# Patient Record
Sex: Male | Born: 1950 | Race: White | Hispanic: No | Marital: Single | Smoking: Never smoker
Health system: Southern US, Community
[De-identification: ages and names within clinical notes are randomized; demographics above are authoritative.]

## PROBLEM LIST (undated history)

## (undated) DIAGNOSIS — R519 Headache, unspecified: Secondary | ICD-10-CM

## (undated) DIAGNOSIS — F410 Panic disorder [episodic paroxysmal anxiety] without agoraphobia: Secondary | ICD-10-CM

## (undated) DIAGNOSIS — R51 Headache: Secondary | ICD-10-CM

## (undated) DIAGNOSIS — M1990 Unspecified osteoarthritis, unspecified site: Secondary | ICD-10-CM

## (undated) DIAGNOSIS — I10 Essential (primary) hypertension: Secondary | ICD-10-CM

## (undated) DIAGNOSIS — E785 Hyperlipidemia, unspecified: Secondary | ICD-10-CM

## (undated) DIAGNOSIS — G709 Myoneural disorder, unspecified: Secondary | ICD-10-CM

## (undated) DIAGNOSIS — F419 Anxiety disorder, unspecified: Secondary | ICD-10-CM

## (undated) DIAGNOSIS — J302 Other seasonal allergic rhinitis: Secondary | ICD-10-CM

## (undated) DIAGNOSIS — R252 Cramp and spasm: Secondary | ICD-10-CM

## (undated) HISTORY — PX: SPINAL CORD STIMULATOR REMOVAL: SHX2423

## (undated) HISTORY — PX: TONSILLECTOMY: SUR1361

## (undated) HISTORY — PX: CHOLECYSTECTOMY: SHX55

## (undated) HISTORY — PX: KNEE SURGERY: SHX244

## (undated) HISTORY — PX: SPINAL CORD STIMULATOR INSERTION: SHX5378

## (undated) HISTORY — PX: WISDOM TOOTH EXTRACTION: SHX21

## (undated) HISTORY — PX: OTHER SURGICAL HISTORY: SHX169

## (undated) HISTORY — PX: BACK SURGERY: SHX140

## (undated) HISTORY — PX: COLONOSCOPY: SHX174

---

## 1999-08-27 ENCOUNTER — Encounter: Payer: Self-pay | Admitting: Neurological Surgery

## 1999-08-27 ENCOUNTER — Ambulatory Visit (HOSPITAL_COMMUNITY): Admission: RE | Admit: 1999-08-27 | Discharge: 1999-08-27 | Payer: Self-pay | Admitting: Neurological Surgery

## 1999-09-21 ENCOUNTER — Encounter: Payer: Self-pay | Admitting: Neurological Surgery

## 1999-09-23 ENCOUNTER — Inpatient Hospital Stay (HOSPITAL_COMMUNITY): Admission: RE | Admit: 1999-09-23 | Discharge: 1999-09-29 | Payer: Self-pay | Admitting: Neurological Surgery

## 1999-09-23 ENCOUNTER — Encounter: Payer: Self-pay | Admitting: Neurological Surgery

## 1999-11-26 ENCOUNTER — Encounter: Payer: Self-pay | Admitting: Neurological Surgery

## 1999-11-26 ENCOUNTER — Encounter: Admission: RE | Admit: 1999-11-26 | Discharge: 1999-11-26 | Payer: Self-pay | Admitting: Neurological Surgery

## 2000-02-16 ENCOUNTER — Encounter: Admission: RE | Admit: 2000-02-16 | Discharge: 2000-02-16 | Payer: Self-pay | Admitting: Neurological Surgery

## 2000-02-16 ENCOUNTER — Encounter: Payer: Self-pay | Admitting: Neurological Surgery

## 2000-09-15 ENCOUNTER — Encounter: Admission: RE | Admit: 2000-09-15 | Discharge: 2000-12-14 | Payer: Self-pay | Admitting: Anesthesiology

## 2000-12-15 ENCOUNTER — Encounter: Admission: RE | Admit: 2000-12-15 | Discharge: 2001-01-06 | Payer: Self-pay | Admitting: Anesthesiology

## 2011-05-18 DIAGNOSIS — E291 Testicular hypofunction: Secondary | ICD-10-CM | POA: Diagnosis not present

## 2011-06-01 DIAGNOSIS — E291 Testicular hypofunction: Secondary | ICD-10-CM | POA: Diagnosis not present

## 2011-06-09 DIAGNOSIS — G890 Central pain syndrome: Secondary | ICD-10-CM | POA: Diagnosis not present

## 2011-06-09 DIAGNOSIS — M5137 Other intervertebral disc degeneration, lumbosacral region: Secondary | ICD-10-CM | POA: Diagnosis not present

## 2011-06-15 DIAGNOSIS — E291 Testicular hypofunction: Secondary | ICD-10-CM | POA: Diagnosis not present

## 2011-06-29 DIAGNOSIS — E291 Testicular hypofunction: Secondary | ICD-10-CM | POA: Diagnosis not present

## 2011-07-13 DIAGNOSIS — E785 Hyperlipidemia, unspecified: Secondary | ICD-10-CM | POA: Diagnosis not present

## 2011-07-13 DIAGNOSIS — I10 Essential (primary) hypertension: Secondary | ICD-10-CM | POA: Diagnosis not present

## 2011-07-13 DIAGNOSIS — Z79899 Other long term (current) drug therapy: Secondary | ICD-10-CM | POA: Diagnosis not present

## 2011-07-13 DIAGNOSIS — E291 Testicular hypofunction: Secondary | ICD-10-CM | POA: Diagnosis not present

## 2011-07-13 DIAGNOSIS — Z6830 Body mass index (BMI) 30.0-30.9, adult: Secondary | ICD-10-CM | POA: Diagnosis not present

## 2011-07-26 DIAGNOSIS — N390 Urinary tract infection, site not specified: Secondary | ICD-10-CM | POA: Diagnosis not present

## 2011-07-26 DIAGNOSIS — E291 Testicular hypofunction: Secondary | ICD-10-CM | POA: Diagnosis not present

## 2011-07-26 DIAGNOSIS — N400 Enlarged prostate without lower urinary tract symptoms: Secondary | ICD-10-CM | POA: Diagnosis not present

## 2011-07-26 DIAGNOSIS — R972 Elevated prostate specific antigen [PSA]: Secondary | ICD-10-CM | POA: Diagnosis not present

## 2011-08-09 DIAGNOSIS — E291 Testicular hypofunction: Secondary | ICD-10-CM | POA: Diagnosis not present

## 2011-08-18 DIAGNOSIS — G890 Central pain syndrome: Secondary | ICD-10-CM | POA: Diagnosis not present

## 2011-08-18 DIAGNOSIS — Z79899 Other long term (current) drug therapy: Secondary | ICD-10-CM | POA: Diagnosis not present

## 2011-08-23 DIAGNOSIS — E291 Testicular hypofunction: Secondary | ICD-10-CM | POA: Diagnosis not present

## 2011-09-07 DIAGNOSIS — E291 Testicular hypofunction: Secondary | ICD-10-CM | POA: Diagnosis not present

## 2011-09-21 DIAGNOSIS — E291 Testicular hypofunction: Secondary | ICD-10-CM | POA: Diagnosis not present

## 2011-10-06 DIAGNOSIS — E291 Testicular hypofunction: Secondary | ICD-10-CM | POA: Diagnosis not present

## 2011-10-20 DIAGNOSIS — E291 Testicular hypofunction: Secondary | ICD-10-CM | POA: Diagnosis not present

## 2011-10-20 DIAGNOSIS — I10 Essential (primary) hypertension: Secondary | ICD-10-CM | POA: Diagnosis not present

## 2011-10-20 DIAGNOSIS — E785 Hyperlipidemia, unspecified: Secondary | ICD-10-CM | POA: Diagnosis not present

## 2011-10-20 DIAGNOSIS — Z6830 Body mass index (BMI) 30.0-30.9, adult: Secondary | ICD-10-CM | POA: Diagnosis not present

## 2011-10-25 DIAGNOSIS — I6529 Occlusion and stenosis of unspecified carotid artery: Secondary | ICD-10-CM | POA: Diagnosis not present

## 2011-10-27 DIAGNOSIS — G894 Chronic pain syndrome: Secondary | ICD-10-CM | POA: Diagnosis not present

## 2011-10-27 DIAGNOSIS — G890 Central pain syndrome: Secondary | ICD-10-CM | POA: Diagnosis not present

## 2011-10-27 DIAGNOSIS — M5137 Other intervertebral disc degeneration, lumbosacral region: Secondary | ICD-10-CM | POA: Diagnosis not present

## 2011-11-03 DIAGNOSIS — E291 Testicular hypofunction: Secondary | ICD-10-CM | POA: Diagnosis not present

## 2011-11-17 DIAGNOSIS — E291 Testicular hypofunction: Secondary | ICD-10-CM | POA: Diagnosis not present

## 2011-12-01 DIAGNOSIS — G890 Central pain syndrome: Secondary | ICD-10-CM | POA: Diagnosis not present

## 2011-12-01 DIAGNOSIS — S142XXA Injury of nerve root of cervical spine, initial encounter: Secondary | ICD-10-CM | POA: Diagnosis not present

## 2011-12-01 DIAGNOSIS — G894 Chronic pain syndrome: Secondary | ICD-10-CM | POA: Diagnosis not present

## 2011-12-02 DIAGNOSIS — E291 Testicular hypofunction: Secondary | ICD-10-CM | POA: Diagnosis not present

## 2011-12-15 DIAGNOSIS — G609 Hereditary and idiopathic neuropathy, unspecified: Secondary | ICD-10-CM | POA: Diagnosis not present

## 2011-12-15 DIAGNOSIS — G890 Central pain syndrome: Secondary | ICD-10-CM | POA: Diagnosis not present

## 2011-12-15 DIAGNOSIS — G894 Chronic pain syndrome: Secondary | ICD-10-CM | POA: Diagnosis not present

## 2011-12-16 DIAGNOSIS — E291 Testicular hypofunction: Secondary | ICD-10-CM | POA: Diagnosis not present

## 2011-12-30 DIAGNOSIS — E291 Testicular hypofunction: Secondary | ICD-10-CM | POA: Diagnosis not present

## 2011-12-30 DIAGNOSIS — G894 Chronic pain syndrome: Secondary | ICD-10-CM | POA: Diagnosis not present

## 2011-12-30 DIAGNOSIS — G890 Central pain syndrome: Secondary | ICD-10-CM | POA: Diagnosis not present

## 2012-01-06 DIAGNOSIS — L219 Seborrheic dermatitis, unspecified: Secondary | ICD-10-CM | POA: Diagnosis not present

## 2012-01-06 DIAGNOSIS — L738 Other specified follicular disorders: Secondary | ICD-10-CM | POA: Diagnosis not present

## 2012-01-06 DIAGNOSIS — L981 Factitial dermatitis: Secondary | ICD-10-CM | POA: Diagnosis not present

## 2012-01-13 DIAGNOSIS — E291 Testicular hypofunction: Secondary | ICD-10-CM | POA: Diagnosis not present

## 2012-01-23 DIAGNOSIS — E785 Hyperlipidemia, unspecified: Secondary | ICD-10-CM | POA: Diagnosis not present

## 2012-01-23 DIAGNOSIS — E291 Testicular hypofunction: Secondary | ICD-10-CM | POA: Diagnosis not present

## 2012-01-23 DIAGNOSIS — I10 Essential (primary) hypertension: Secondary | ICD-10-CM | POA: Diagnosis not present

## 2012-01-26 DIAGNOSIS — G894 Chronic pain syndrome: Secondary | ICD-10-CM | POA: Diagnosis not present

## 2012-01-26 DIAGNOSIS — G890 Central pain syndrome: Secondary | ICD-10-CM | POA: Diagnosis not present

## 2012-01-27 DIAGNOSIS — E291 Testicular hypofunction: Secondary | ICD-10-CM | POA: Diagnosis not present

## 2012-02-03 DIAGNOSIS — Z79899 Other long term (current) drug therapy: Secondary | ICD-10-CM | POA: Diagnosis not present

## 2012-02-03 DIAGNOSIS — G890 Central pain syndrome: Secondary | ICD-10-CM | POA: Diagnosis not present

## 2012-02-03 DIAGNOSIS — G894 Chronic pain syndrome: Secondary | ICD-10-CM | POA: Diagnosis not present

## 2012-02-03 DIAGNOSIS — Z5181 Encounter for therapeutic drug level monitoring: Secondary | ICD-10-CM | POA: Diagnosis not present

## 2012-02-10 DIAGNOSIS — E291 Testicular hypofunction: Secondary | ICD-10-CM | POA: Diagnosis not present

## 2012-02-10 DIAGNOSIS — Z23 Encounter for immunization: Secondary | ICD-10-CM | POA: Diagnosis not present

## 2012-02-24 DIAGNOSIS — E291 Testicular hypofunction: Secondary | ICD-10-CM | POA: Diagnosis not present

## 2012-02-29 DIAGNOSIS — G890 Central pain syndrome: Secondary | ICD-10-CM | POA: Diagnosis not present

## 2012-03-09 DIAGNOSIS — E291 Testicular hypofunction: Secondary | ICD-10-CM | POA: Diagnosis not present

## 2012-03-20 DIAGNOSIS — G894 Chronic pain syndrome: Secondary | ICD-10-CM | POA: Diagnosis not present

## 2012-03-20 DIAGNOSIS — M5137 Other intervertebral disc degeneration, lumbosacral region: Secondary | ICD-10-CM | POA: Diagnosis not present

## 2012-03-23 DIAGNOSIS — E291 Testicular hypofunction: Secondary | ICD-10-CM | POA: Diagnosis not present

## 2012-03-28 DIAGNOSIS — G894 Chronic pain syndrome: Secondary | ICD-10-CM | POA: Diagnosis not present

## 2012-03-28 DIAGNOSIS — G890 Central pain syndrome: Secondary | ICD-10-CM | POA: Diagnosis not present

## 2012-04-06 DIAGNOSIS — E291 Testicular hypofunction: Secondary | ICD-10-CM | POA: Diagnosis not present

## 2012-04-20 DIAGNOSIS — E291 Testicular hypofunction: Secondary | ICD-10-CM | POA: Diagnosis not present

## 2012-04-26 DIAGNOSIS — E291 Testicular hypofunction: Secondary | ICD-10-CM | POA: Diagnosis not present

## 2012-04-26 DIAGNOSIS — Z6829 Body mass index (BMI) 29.0-29.9, adult: Secondary | ICD-10-CM | POA: Diagnosis not present

## 2012-04-26 DIAGNOSIS — E785 Hyperlipidemia, unspecified: Secondary | ICD-10-CM | POA: Diagnosis not present

## 2012-04-26 DIAGNOSIS — I10 Essential (primary) hypertension: Secondary | ICD-10-CM | POA: Diagnosis not present

## 2012-04-26 DIAGNOSIS — R972 Elevated prostate specific antigen [PSA]: Secondary | ICD-10-CM | POA: Diagnosis not present

## 2012-05-04 DIAGNOSIS — E291 Testicular hypofunction: Secondary | ICD-10-CM | POA: Diagnosis not present

## 2012-05-10 DIAGNOSIS — G894 Chronic pain syndrome: Secondary | ICD-10-CM | POA: Diagnosis not present

## 2012-05-10 DIAGNOSIS — G890 Central pain syndrome: Secondary | ICD-10-CM | POA: Diagnosis not present

## 2012-05-17 DIAGNOSIS — G894 Chronic pain syndrome: Secondary | ICD-10-CM | POA: Diagnosis not present

## 2012-05-17 DIAGNOSIS — G890 Central pain syndrome: Secondary | ICD-10-CM | POA: Diagnosis not present

## 2012-05-24 DIAGNOSIS — G890 Central pain syndrome: Secondary | ICD-10-CM | POA: Diagnosis not present

## 2012-05-24 DIAGNOSIS — E291 Testicular hypofunction: Secondary | ICD-10-CM | POA: Diagnosis not present

## 2012-05-24 DIAGNOSIS — G8921 Chronic pain due to trauma: Secondary | ICD-10-CM | POA: Diagnosis not present

## 2012-05-24 DIAGNOSIS — M545 Low back pain: Secondary | ICD-10-CM | POA: Diagnosis not present

## 2012-05-24 DIAGNOSIS — G8929 Other chronic pain: Secondary | ICD-10-CM | POA: Diagnosis not present

## 2012-05-24 DIAGNOSIS — G894 Chronic pain syndrome: Secondary | ICD-10-CM | POA: Diagnosis not present

## 2012-06-05 DIAGNOSIS — R52 Pain, unspecified: Secondary | ICD-10-CM | POA: Diagnosis not present

## 2012-06-05 DIAGNOSIS — G894 Chronic pain syndrome: Secondary | ICD-10-CM | POA: Diagnosis not present

## 2012-06-05 DIAGNOSIS — G890 Central pain syndrome: Secondary | ICD-10-CM | POA: Diagnosis not present

## 2012-06-08 DIAGNOSIS — E291 Testicular hypofunction: Secondary | ICD-10-CM | POA: Diagnosis not present

## 2012-06-22 DIAGNOSIS — E291 Testicular hypofunction: Secondary | ICD-10-CM | POA: Diagnosis not present

## 2012-07-06 DIAGNOSIS — Z6829 Body mass index (BMI) 29.0-29.9, adult: Secondary | ICD-10-CM | POA: Diagnosis not present

## 2012-07-06 DIAGNOSIS — J019 Acute sinusitis, unspecified: Secondary | ICD-10-CM | POA: Diagnosis not present

## 2012-07-06 DIAGNOSIS — E291 Testicular hypofunction: Secondary | ICD-10-CM | POA: Diagnosis not present

## 2012-07-06 DIAGNOSIS — S14125A Central cord syndrome at C5 level of cervical spinal cord, initial encounter: Secondary | ICD-10-CM | POA: Diagnosis not present

## 2012-07-20 DIAGNOSIS — E291 Testicular hypofunction: Secondary | ICD-10-CM | POA: Diagnosis not present

## 2012-08-03 DIAGNOSIS — E291 Testicular hypofunction: Secondary | ICD-10-CM | POA: Diagnosis not present

## 2012-08-03 DIAGNOSIS — R972 Elevated prostate specific antigen [PSA]: Secondary | ICD-10-CM | POA: Diagnosis not present

## 2012-08-03 DIAGNOSIS — Z6829 Body mass index (BMI) 29.0-29.9, adult: Secondary | ICD-10-CM | POA: Diagnosis not present

## 2012-08-03 DIAGNOSIS — I10 Essential (primary) hypertension: Secondary | ICD-10-CM | POA: Diagnosis not present

## 2012-08-03 DIAGNOSIS — E785 Hyperlipidemia, unspecified: Secondary | ICD-10-CM | POA: Diagnosis not present

## 2012-08-17 DIAGNOSIS — E291 Testicular hypofunction: Secondary | ICD-10-CM | POA: Diagnosis not present

## 2012-08-23 DIAGNOSIS — G894 Chronic pain syndrome: Secondary | ICD-10-CM | POA: Diagnosis not present

## 2012-08-23 DIAGNOSIS — G890 Central pain syndrome: Secondary | ICD-10-CM | POA: Diagnosis not present

## 2012-08-23 DIAGNOSIS — M79609 Pain in unspecified limb: Secondary | ICD-10-CM | POA: Diagnosis not present

## 2012-08-23 DIAGNOSIS — Z79899 Other long term (current) drug therapy: Secondary | ICD-10-CM | POA: Diagnosis not present

## 2012-08-31 DIAGNOSIS — E291 Testicular hypofunction: Secondary | ICD-10-CM | POA: Diagnosis not present

## 2012-09-14 DIAGNOSIS — E291 Testicular hypofunction: Secondary | ICD-10-CM | POA: Diagnosis not present

## 2012-09-19 DIAGNOSIS — M79609 Pain in unspecified limb: Secondary | ICD-10-CM | POA: Diagnosis not present

## 2012-09-19 DIAGNOSIS — Z79899 Other long term (current) drug therapy: Secondary | ICD-10-CM | POA: Diagnosis not present

## 2012-09-19 DIAGNOSIS — G890 Central pain syndrome: Secondary | ICD-10-CM | POA: Diagnosis not present

## 2012-09-19 DIAGNOSIS — G894 Chronic pain syndrome: Secondary | ICD-10-CM | POA: Diagnosis not present

## 2012-09-27 DIAGNOSIS — E291 Testicular hypofunction: Secondary | ICD-10-CM | POA: Diagnosis not present

## 2012-10-11 DIAGNOSIS — E291 Testicular hypofunction: Secondary | ICD-10-CM | POA: Diagnosis not present

## 2012-10-25 DIAGNOSIS — E291 Testicular hypofunction: Secondary | ICD-10-CM | POA: Diagnosis not present

## 2012-11-08 DIAGNOSIS — E291 Testicular hypofunction: Secondary | ICD-10-CM | POA: Diagnosis not present

## 2012-11-08 DIAGNOSIS — I10 Essential (primary) hypertension: Secondary | ICD-10-CM | POA: Diagnosis not present

## 2012-11-08 DIAGNOSIS — Z6829 Body mass index (BMI) 29.0-29.9, adult: Secondary | ICD-10-CM | POA: Diagnosis not present

## 2012-11-08 DIAGNOSIS — E785 Hyperlipidemia, unspecified: Secondary | ICD-10-CM | POA: Diagnosis not present

## 2012-11-14 DIAGNOSIS — Z79899 Other long term (current) drug therapy: Secondary | ICD-10-CM | POA: Diagnosis not present

## 2012-11-14 DIAGNOSIS — G894 Chronic pain syndrome: Secondary | ICD-10-CM | POA: Diagnosis not present

## 2012-11-14 DIAGNOSIS — M79609 Pain in unspecified limb: Secondary | ICD-10-CM | POA: Diagnosis not present

## 2012-11-14 DIAGNOSIS — G890 Central pain syndrome: Secondary | ICD-10-CM | POA: Diagnosis not present

## 2012-11-22 DIAGNOSIS — E291 Testicular hypofunction: Secondary | ICD-10-CM | POA: Diagnosis not present

## 2012-12-06 DIAGNOSIS — E291 Testicular hypofunction: Secondary | ICD-10-CM | POA: Diagnosis not present

## 2012-12-17 DIAGNOSIS — Z4689 Encounter for fitting and adjustment of other specified devices: Secondary | ICD-10-CM | POA: Diagnosis not present

## 2012-12-17 DIAGNOSIS — M549 Dorsalgia, unspecified: Secondary | ICD-10-CM | POA: Diagnosis not present

## 2012-12-17 DIAGNOSIS — E7800 Pure hypercholesterolemia, unspecified: Secondary | ICD-10-CM | POA: Diagnosis not present

## 2012-12-17 DIAGNOSIS — F411 Generalized anxiety disorder: Secondary | ICD-10-CM | POA: Diagnosis not present

## 2012-12-17 DIAGNOSIS — IMO0001 Reserved for inherently not codable concepts without codable children: Secondary | ICD-10-CM | POA: Diagnosis not present

## 2012-12-17 DIAGNOSIS — G894 Chronic pain syndrome: Secondary | ICD-10-CM | POA: Diagnosis not present

## 2012-12-17 DIAGNOSIS — G609 Hereditary and idiopathic neuropathy, unspecified: Secondary | ICD-10-CM | POA: Diagnosis not present

## 2012-12-17 DIAGNOSIS — I10 Essential (primary) hypertension: Secondary | ICD-10-CM | POA: Diagnosis not present

## 2012-12-17 DIAGNOSIS — R209 Unspecified disturbances of skin sensation: Secondary | ICD-10-CM | POA: Diagnosis not present

## 2012-12-17 DIAGNOSIS — G890 Central pain syndrome: Secondary | ICD-10-CM | POA: Diagnosis not present

## 2012-12-20 DIAGNOSIS — E291 Testicular hypofunction: Secondary | ICD-10-CM | POA: Diagnosis not present

## 2013-01-03 DIAGNOSIS — E291 Testicular hypofunction: Secondary | ICD-10-CM | POA: Diagnosis not present

## 2013-01-09 DIAGNOSIS — M79609 Pain in unspecified limb: Secondary | ICD-10-CM | POA: Diagnosis not present

## 2013-01-09 DIAGNOSIS — Z79899 Other long term (current) drug therapy: Secondary | ICD-10-CM | POA: Diagnosis not present

## 2013-01-09 DIAGNOSIS — G890 Central pain syndrome: Secondary | ICD-10-CM | POA: Diagnosis not present

## 2013-01-09 DIAGNOSIS — G894 Chronic pain syndrome: Secondary | ICD-10-CM | POA: Diagnosis not present

## 2013-01-17 DIAGNOSIS — E291 Testicular hypofunction: Secondary | ICD-10-CM | POA: Diagnosis not present

## 2013-01-17 DIAGNOSIS — L981 Factitial dermatitis: Secondary | ICD-10-CM | POA: Diagnosis not present

## 2013-01-17 DIAGNOSIS — L219 Seborrheic dermatitis, unspecified: Secondary | ICD-10-CM | POA: Diagnosis not present

## 2013-01-31 DIAGNOSIS — E291 Testicular hypofunction: Secondary | ICD-10-CM | POA: Diagnosis not present

## 2013-02-14 DIAGNOSIS — E785 Hyperlipidemia, unspecified: Secondary | ICD-10-CM | POA: Diagnosis not present

## 2013-02-14 DIAGNOSIS — Z23 Encounter for immunization: Secondary | ICD-10-CM | POA: Diagnosis not present

## 2013-02-14 DIAGNOSIS — I10 Essential (primary) hypertension: Secondary | ICD-10-CM | POA: Diagnosis not present

## 2013-02-14 DIAGNOSIS — E291 Testicular hypofunction: Secondary | ICD-10-CM | POA: Diagnosis not present

## 2013-02-28 DIAGNOSIS — E291 Testicular hypofunction: Secondary | ICD-10-CM | POA: Diagnosis not present

## 2013-03-07 DIAGNOSIS — G890 Central pain syndrome: Secondary | ICD-10-CM | POA: Diagnosis not present

## 2013-03-07 DIAGNOSIS — Z79899 Other long term (current) drug therapy: Secondary | ICD-10-CM | POA: Diagnosis not present

## 2013-03-07 DIAGNOSIS — G894 Chronic pain syndrome: Secondary | ICD-10-CM | POA: Diagnosis not present

## 2013-03-07 DIAGNOSIS — M79609 Pain in unspecified limb: Secondary | ICD-10-CM | POA: Diagnosis not present

## 2013-03-14 DIAGNOSIS — E291 Testicular hypofunction: Secondary | ICD-10-CM | POA: Diagnosis not present

## 2013-03-25 DIAGNOSIS — G894 Chronic pain syndrome: Secondary | ICD-10-CM | POA: Diagnosis not present

## 2013-03-25 DIAGNOSIS — Z79899 Other long term (current) drug therapy: Secondary | ICD-10-CM | POA: Diagnosis not present

## 2013-03-25 DIAGNOSIS — Z5181 Encounter for therapeutic drug level monitoring: Secondary | ICD-10-CM | POA: Diagnosis not present

## 2013-03-25 DIAGNOSIS — G890 Central pain syndrome: Secondary | ICD-10-CM | POA: Diagnosis not present

## 2013-03-25 DIAGNOSIS — M79609 Pain in unspecified limb: Secondary | ICD-10-CM | POA: Diagnosis not present

## 2013-03-28 DIAGNOSIS — E291 Testicular hypofunction: Secondary | ICD-10-CM | POA: Diagnosis not present

## 2013-04-11 DIAGNOSIS — E291 Testicular hypofunction: Secondary | ICD-10-CM | POA: Diagnosis not present

## 2013-04-22 DIAGNOSIS — M79609 Pain in unspecified limb: Secondary | ICD-10-CM | POA: Diagnosis not present

## 2013-04-22 DIAGNOSIS — M62838 Other muscle spasm: Secondary | ICD-10-CM | POA: Diagnosis not present

## 2013-04-22 DIAGNOSIS — G890 Central pain syndrome: Secondary | ICD-10-CM | POA: Diagnosis not present

## 2013-04-22 DIAGNOSIS — G894 Chronic pain syndrome: Secondary | ICD-10-CM | POA: Diagnosis not present

## 2013-04-25 DIAGNOSIS — E291 Testicular hypofunction: Secondary | ICD-10-CM | POA: Diagnosis not present

## 2013-05-10 DIAGNOSIS — E291 Testicular hypofunction: Secondary | ICD-10-CM | POA: Diagnosis not present

## 2013-05-23 DIAGNOSIS — I10 Essential (primary) hypertension: Secondary | ICD-10-CM | POA: Diagnosis not present

## 2013-05-23 DIAGNOSIS — E785 Hyperlipidemia, unspecified: Secondary | ICD-10-CM | POA: Diagnosis not present

## 2013-05-23 DIAGNOSIS — Z6829 Body mass index (BMI) 29.0-29.9, adult: Secondary | ICD-10-CM | POA: Diagnosis not present

## 2013-05-23 DIAGNOSIS — E291 Testicular hypofunction: Secondary | ICD-10-CM | POA: Diagnosis not present

## 2013-06-06 DIAGNOSIS — E291 Testicular hypofunction: Secondary | ICD-10-CM | POA: Diagnosis not present

## 2013-06-13 DIAGNOSIS — Z79899 Other long term (current) drug therapy: Secondary | ICD-10-CM | POA: Diagnosis not present

## 2013-06-13 DIAGNOSIS — M62838 Other muscle spasm: Secondary | ICD-10-CM | POA: Diagnosis not present

## 2013-06-13 DIAGNOSIS — G894 Chronic pain syndrome: Secondary | ICD-10-CM | POA: Diagnosis not present

## 2013-06-13 DIAGNOSIS — G890 Central pain syndrome: Secondary | ICD-10-CM | POA: Diagnosis not present

## 2013-06-20 DIAGNOSIS — E291 Testicular hypofunction: Secondary | ICD-10-CM | POA: Diagnosis not present

## 2013-07-10 DIAGNOSIS — E291 Testicular hypofunction: Secondary | ICD-10-CM | POA: Diagnosis not present

## 2013-07-18 DIAGNOSIS — L981 Factitial dermatitis: Secondary | ICD-10-CM | POA: Diagnosis not present

## 2013-07-18 DIAGNOSIS — L219 Seborrheic dermatitis, unspecified: Secondary | ICD-10-CM | POA: Diagnosis not present

## 2013-07-24 DIAGNOSIS — E291 Testicular hypofunction: Secondary | ICD-10-CM | POA: Diagnosis not present

## 2013-08-08 DIAGNOSIS — E291 Testicular hypofunction: Secondary | ICD-10-CM | POA: Diagnosis not present

## 2013-08-19 DIAGNOSIS — IMO0002 Reserved for concepts with insufficient information to code with codable children: Secondary | ICD-10-CM | POA: Diagnosis not present

## 2013-08-19 DIAGNOSIS — G894 Chronic pain syndrome: Secondary | ICD-10-CM | POA: Diagnosis not present

## 2013-08-19 DIAGNOSIS — G8250 Quadriplegia, unspecified: Secondary | ICD-10-CM | POA: Diagnosis not present

## 2013-08-19 DIAGNOSIS — R259 Unspecified abnormal involuntary movements: Secondary | ICD-10-CM | POA: Diagnosis not present

## 2013-08-26 DIAGNOSIS — E785 Hyperlipidemia, unspecified: Secondary | ICD-10-CM | POA: Diagnosis not present

## 2013-08-26 DIAGNOSIS — E291 Testicular hypofunction: Secondary | ICD-10-CM | POA: Diagnosis not present

## 2013-08-26 DIAGNOSIS — Z125 Encounter for screening for malignant neoplasm of prostate: Secondary | ICD-10-CM | POA: Diagnosis not present

## 2013-08-26 DIAGNOSIS — I10 Essential (primary) hypertension: Secondary | ICD-10-CM | POA: Diagnosis not present

## 2013-08-26 DIAGNOSIS — Z6829 Body mass index (BMI) 29.0-29.9, adult: Secondary | ICD-10-CM | POA: Diagnosis not present

## 2013-08-29 DIAGNOSIS — L219 Seborrheic dermatitis, unspecified: Secondary | ICD-10-CM | POA: Diagnosis not present

## 2013-08-29 DIAGNOSIS — L719 Rosacea, unspecified: Secondary | ICD-10-CM | POA: Diagnosis not present

## 2013-09-05 DIAGNOSIS — G894 Chronic pain syndrome: Secondary | ICD-10-CM | POA: Diagnosis not present

## 2013-09-05 DIAGNOSIS — R259 Unspecified abnormal involuntary movements: Secondary | ICD-10-CM | POA: Diagnosis not present

## 2013-09-11 DIAGNOSIS — M48061 Spinal stenosis, lumbar region without neurogenic claudication: Secondary | ICD-10-CM | POA: Diagnosis not present

## 2013-09-11 DIAGNOSIS — E291 Testicular hypofunction: Secondary | ICD-10-CM | POA: Diagnosis not present

## 2013-09-11 DIAGNOSIS — R29898 Other symptoms and signs involving the musculoskeletal system: Secondary | ICD-10-CM | POA: Diagnosis not present

## 2013-09-11 DIAGNOSIS — M5124 Other intervertebral disc displacement, thoracic region: Secondary | ICD-10-CM | POA: Diagnosis not present

## 2013-09-26 DIAGNOSIS — E291 Testicular hypofunction: Secondary | ICD-10-CM | POA: Diagnosis not present

## 2013-10-10 DIAGNOSIS — E291 Testicular hypofunction: Secondary | ICD-10-CM | POA: Diagnosis not present

## 2013-10-10 DIAGNOSIS — Z6827 Body mass index (BMI) 27.0-27.9, adult: Secondary | ICD-10-CM | POA: Diagnosis not present

## 2013-10-10 DIAGNOSIS — F410 Panic disorder [episodic paroxysmal anxiety] without agoraphobia: Secondary | ICD-10-CM | POA: Diagnosis not present

## 2013-10-24 DIAGNOSIS — E291 Testicular hypofunction: Secondary | ICD-10-CM | POA: Diagnosis not present

## 2013-10-30 DIAGNOSIS — G894 Chronic pain syndrome: Secondary | ICD-10-CM | POA: Diagnosis not present

## 2013-10-30 DIAGNOSIS — R259 Unspecified abnormal involuntary movements: Secondary | ICD-10-CM | POA: Diagnosis not present

## 2013-10-30 DIAGNOSIS — IMO0002 Reserved for concepts with insufficient information to code with codable children: Secondary | ICD-10-CM | POA: Diagnosis not present

## 2013-10-30 DIAGNOSIS — Z79899 Other long term (current) drug therapy: Secondary | ICD-10-CM | POA: Diagnosis not present

## 2013-10-30 DIAGNOSIS — G8250 Quadriplegia, unspecified: Secondary | ICD-10-CM | POA: Diagnosis not present

## 2013-11-06 DIAGNOSIS — E291 Testicular hypofunction: Secondary | ICD-10-CM | POA: Diagnosis not present

## 2013-11-20 DIAGNOSIS — E291 Testicular hypofunction: Secondary | ICD-10-CM | POA: Diagnosis not present

## 2013-12-04 DIAGNOSIS — E291 Testicular hypofunction: Secondary | ICD-10-CM | POA: Diagnosis not present

## 2013-12-04 DIAGNOSIS — Z23 Encounter for immunization: Secondary | ICD-10-CM | POA: Diagnosis not present

## 2013-12-04 DIAGNOSIS — I10 Essential (primary) hypertension: Secondary | ICD-10-CM | POA: Diagnosis not present

## 2013-12-04 DIAGNOSIS — Z6828 Body mass index (BMI) 28.0-28.9, adult: Secondary | ICD-10-CM | POA: Diagnosis not present

## 2013-12-04 DIAGNOSIS — E785 Hyperlipidemia, unspecified: Secondary | ICD-10-CM | POA: Diagnosis not present

## 2013-12-19 DIAGNOSIS — E291 Testicular hypofunction: Secondary | ICD-10-CM | POA: Diagnosis not present

## 2013-12-25 DIAGNOSIS — G8250 Quadriplegia, unspecified: Secondary | ICD-10-CM | POA: Diagnosis not present

## 2013-12-25 DIAGNOSIS — G890 Central pain syndrome: Secondary | ICD-10-CM | POA: Diagnosis not present

## 2013-12-25 DIAGNOSIS — IMO0002 Reserved for concepts with insufficient information to code with codable children: Secondary | ICD-10-CM | POA: Diagnosis not present

## 2013-12-25 DIAGNOSIS — G894 Chronic pain syndrome: Secondary | ICD-10-CM | POA: Diagnosis not present

## 2013-12-26 DIAGNOSIS — L219 Seborrheic dermatitis, unspecified: Secondary | ICD-10-CM | POA: Diagnosis not present

## 2013-12-26 DIAGNOSIS — L719 Rosacea, unspecified: Secondary | ICD-10-CM | POA: Diagnosis not present

## 2013-12-26 DIAGNOSIS — L981 Factitial dermatitis: Secondary | ICD-10-CM | POA: Diagnosis not present

## 2014-01-02 DIAGNOSIS — E291 Testicular hypofunction: Secondary | ICD-10-CM | POA: Diagnosis not present

## 2014-01-16 DIAGNOSIS — G894 Chronic pain syndrome: Secondary | ICD-10-CM | POA: Diagnosis not present

## 2014-01-16 DIAGNOSIS — G890 Central pain syndrome: Secondary | ICD-10-CM | POA: Diagnosis not present

## 2014-01-16 DIAGNOSIS — G8250 Quadriplegia, unspecified: Secondary | ICD-10-CM | POA: Diagnosis not present

## 2014-01-16 DIAGNOSIS — IMO0002 Reserved for concepts with insufficient information to code with codable children: Secondary | ICD-10-CM | POA: Diagnosis not present

## 2014-01-17 DIAGNOSIS — E291 Testicular hypofunction: Secondary | ICD-10-CM | POA: Diagnosis not present

## 2014-01-29 DIAGNOSIS — E291 Testicular hypofunction: Secondary | ICD-10-CM | POA: Diagnosis not present

## 2014-02-12 DIAGNOSIS — E290 Testicular hyperfunction: Secondary | ICD-10-CM | POA: Diagnosis not present

## 2014-02-13 DIAGNOSIS — G8250 Quadriplegia, unspecified: Secondary | ICD-10-CM | POA: Diagnosis not present

## 2014-02-13 DIAGNOSIS — G890 Central pain syndrome: Secondary | ICD-10-CM | POA: Diagnosis not present

## 2014-02-13 DIAGNOSIS — M4806 Spinal stenosis, lumbar region: Secondary | ICD-10-CM | POA: Diagnosis not present

## 2014-02-13 DIAGNOSIS — M545 Low back pain: Secondary | ICD-10-CM | POA: Diagnosis not present

## 2014-02-26 DIAGNOSIS — E291 Testicular hypofunction: Secondary | ICD-10-CM | POA: Diagnosis not present

## 2014-02-28 ENCOUNTER — Other Ambulatory Visit: Payer: Self-pay | Admitting: Neurological Surgery

## 2014-02-28 DIAGNOSIS — M4802 Spinal stenosis, cervical region: Secondary | ICD-10-CM | POA: Diagnosis not present

## 2014-02-28 DIAGNOSIS — R030 Elevated blood-pressure reading, without diagnosis of hypertension: Secondary | ICD-10-CM | POA: Diagnosis not present

## 2014-02-28 DIAGNOSIS — M4715 Other spondylosis with myelopathy, thoracolumbar region: Secondary | ICD-10-CM | POA: Diagnosis not present

## 2014-02-28 DIAGNOSIS — Z6828 Body mass index (BMI) 28.0-28.9, adult: Secondary | ICD-10-CM | POA: Diagnosis not present

## 2014-03-09 ENCOUNTER — Ambulatory Visit
Admission: RE | Admit: 2014-03-09 | Discharge: 2014-03-09 | Disposition: A | Discharge status: Home / Self Care | Payer: Medicare Other | Source: Ambulatory Visit | Attending: Neurological Surgery | Admitting: Neurological Surgery

## 2014-03-09 DIAGNOSIS — M4802 Spinal stenosis, cervical region: Secondary | ICD-10-CM

## 2014-03-09 DIAGNOSIS — M4322 Fusion of spine, cervical region: Secondary | ICD-10-CM | POA: Diagnosis not present

## 2014-03-09 DIAGNOSIS — M5023 Other cervical disc displacement, cervicothoracic region: Secondary | ICD-10-CM | POA: Diagnosis not present

## 2014-03-09 DIAGNOSIS — M47812 Spondylosis without myelopathy or radiculopathy, cervical region: Secondary | ICD-10-CM | POA: Diagnosis not present

## 2014-03-12 DIAGNOSIS — I10 Essential (primary) hypertension: Secondary | ICD-10-CM | POA: Diagnosis not present

## 2014-03-12 DIAGNOSIS — Z23 Encounter for immunization: Secondary | ICD-10-CM | POA: Diagnosis not present

## 2014-03-12 DIAGNOSIS — Z6828 Body mass index (BMI) 28.0-28.9, adult: Secondary | ICD-10-CM | POA: Diagnosis not present

## 2014-03-12 DIAGNOSIS — F410 Panic disorder [episodic paroxysmal anxiety] without agoraphobia: Secondary | ICD-10-CM | POA: Diagnosis not present

## 2014-03-12 DIAGNOSIS — E785 Hyperlipidemia, unspecified: Secondary | ICD-10-CM | POA: Diagnosis not present

## 2014-03-12 DIAGNOSIS — E291 Testicular hypofunction: Secondary | ICD-10-CM | POA: Diagnosis not present

## 2014-03-19 DIAGNOSIS — M539 Dorsopathy, unspecified: Secondary | ICD-10-CM | POA: Diagnosis not present

## 2014-03-19 DIAGNOSIS — Z79899 Other long term (current) drug therapy: Secondary | ICD-10-CM | POA: Diagnosis not present

## 2014-03-19 DIAGNOSIS — Z5181 Encounter for therapeutic drug level monitoring: Secondary | ICD-10-CM | POA: Diagnosis not present

## 2014-03-19 DIAGNOSIS — G894 Chronic pain syndrome: Secondary | ICD-10-CM | POA: Diagnosis not present

## 2014-03-26 DIAGNOSIS — I10 Essential (primary) hypertension: Secondary | ICD-10-CM | POA: Diagnosis not present

## 2014-03-26 DIAGNOSIS — M47812 Spondylosis without myelopathy or radiculopathy, cervical region: Secondary | ICD-10-CM | POA: Diagnosis not present

## 2014-03-26 DIAGNOSIS — Z6827 Body mass index (BMI) 27.0-27.9, adult: Secondary | ICD-10-CM | POA: Diagnosis not present

## 2014-03-27 ENCOUNTER — Other Ambulatory Visit: Payer: Self-pay | Admitting: Neurological Surgery

## 2014-03-28 DIAGNOSIS — E291 Testicular hypofunction: Secondary | ICD-10-CM | POA: Diagnosis not present

## 2014-04-01 DIAGNOSIS — L219 Seborrheic dermatitis, unspecified: Secondary | ICD-10-CM | POA: Diagnosis not present

## 2014-04-01 DIAGNOSIS — L719 Rosacea, unspecified: Secondary | ICD-10-CM | POA: Diagnosis not present

## 2014-04-04 ENCOUNTER — Encounter (HOSPITAL_COMMUNITY)
Admission: RE | Admit: 2014-04-04 | Discharge: 2014-04-04 | Disposition: A | Discharge status: Home / Self Care | Payer: Medicare Other | Source: Ambulatory Visit | Attending: Neurological Surgery | Admitting: Neurological Surgery

## 2014-04-04 ENCOUNTER — Other Ambulatory Visit: Payer: Self-pay

## 2014-04-04 ENCOUNTER — Encounter (HOSPITAL_COMMUNITY): Payer: Self-pay

## 2014-04-04 DIAGNOSIS — Z01818 Encounter for other preprocedural examination: Secondary | ICD-10-CM | POA: Diagnosis not present

## 2014-04-04 HISTORY — DX: Headache: R51

## 2014-04-04 HISTORY — DX: Headache, unspecified: R51.9

## 2014-04-04 LAB — SURGICAL PCR SCREEN
MRSA, PCR: NEGATIVE
Staphylococcus aureus: NEGATIVE

## 2014-04-04 LAB — CBC
HCT: 47 % (ref 39.0–52.0)
Hemoglobin: 16.3 g/dL (ref 13.0–17.0)
MCH: 31.5 pg (ref 26.0–34.0)
MCHC: 34.7 g/dL (ref 30.0–36.0)
MCV: 90.7 fL (ref 78.0–100.0)
PLATELETS: 203 10*3/uL (ref 150–400)
RBC: 5.18 MIL/uL (ref 4.22–5.81)
RDW: 13.1 % (ref 11.5–15.5)
WBC: 7.3 10*3/uL (ref 4.0–10.5)

## 2014-04-04 LAB — BASIC METABOLIC PANEL
Anion gap: 15 (ref 5–15)
BUN: 20 mg/dL (ref 6–23)
CO2: 25 meq/L (ref 19–32)
CREATININE: 1.1 mg/dL (ref 0.50–1.35)
Calcium: 9.4 mg/dL (ref 8.4–10.5)
Chloride: 100 mEq/L (ref 96–112)
GFR calc Af Amer: 81 mL/min — ABNORMAL LOW (ref >90)
GFR, EST NON AFRICAN AMERICAN: 70 mL/min — AB (ref >90)
Glucose, Bld: 86 mg/dL (ref 70–99)
Potassium: 4.2 mEq/L (ref 3.7–5.3)
Sodium: 140 mEq/L (ref 137–147)

## 2014-04-04 NOTE — Pre-Procedure Instructions (Signed)
Rudene ChristiansKent M Brents  04/04/2014   Your procedure is scheduled on:  Dec 4 at 1210  [facility] Admitting at [phone] AM.  Call this number if you have problems the morning of surgery: [phone]   Remember:   Do not eat food or drink liquids after midnight.   Take these medicines the morning of surgery with A SIP OF WATER: Clonazepam (Klonopin) if needed, Morphine (Ms Contin)  Stop taking Aspirin, Aleve, Ibuprofen, BC's, Goody's, Herbal medications, Fish Oil   Do not wear jewelry, make-up or nail polish.  Do not wear lotions, powders, or perfumes. You may wear deodorant.  Do not shave 48 hours prior to surgery. Men may shave face and neck.  Do not bring valuables to the hospital.  [facility] is not responsible  for any belongings or valuables.               Contacts, dentures or bridgework may not be worn into surgery.  Leave suitcase in the car. After surgery it may be brought to your room.  For patients admitted to the hospital, discharge time is determined by your    treatment team.               Patients discharged the day of surgery will not be allowed to drive home.    Special Instructions: [facility] - Preparing for Surgery  Before surgery, you can play an important role.  Because skin is not sterile, your skin needs to be as free of germs as possible.  You can reduce the number of germs on you skin by washing with CHG (chlorahexidine gluconate) soap before surgery.  CHG is an antiseptic cleaner which kills germs and bonds with the skin to continue killing germs even after washing.  Please DO NOT use if you have an allergy to CHG or antibacterial soaps.  If your skin becomes reddened/irritated stop using the CHG and inform your nurse when you arrive at Short Stay.  Do not shave (including legs and underarms) for at least 48 hours prior to the first CHG shower.  You may shave your face.  Please follow these instructions carefully:   1.  Shower with CHG Soap the  night before surgery and the                                morning of Surgery.  2.  If you choose to wash your hair, wash your hair first as usual with your       normal shampoo.  3.  After you shampoo, rinse your hair and body thoroughly to remove the                      Shampoo.  4.  Use CHG as you would any other liquid soap.  You can apply chg directly       to the skin and wash gently with scrungie or a clean washcloth.  5.  Apply the CHG Soap to your body ONLY FROM THE NECK DOWN.        Do not use on open wounds or open sores.  Avoid contact with your eyes,       ears, mouth and genitals (private parts).  Wash genitals (private parts)       with your normal soap.  6.  Wash thoroughly, paying special attention to the area where your surgery  will be performed.  7.  Thoroughly rinse your body with warm water from the neck down.  8.  DO NOT shower/wash with your normal soap after using and rinsing off       the CHG Soap.  9.  Pat yourself dry with a clean towel.            10.  Wear clean pajamas.            11.  Place clean sheets on your bed the night of your first shower and do not        sleep with pets.  Day of Surgery  Do not apply any lotions/deoderants the morning of surgery.  Please wear clean clothes to the hospital/surgery center.      Please read over the following fact sheets that you were given: Pain Booklet, Coughing and Deep Breathing, MRSA Information and Surgical Site Infection Prevention

## 2014-04-04 NOTE — Progress Notes (Signed)
PCP is Dr [place]las [place] Denies seeing a cardiologist Denies having a card cath or echo States that he may have had a stress test 6 or so years ago at [facility] [place], but is not sure.  Request sent for stress test.

## 2014-04-09 DIAGNOSIS — E291 Testicular hypofunction: Secondary | ICD-10-CM | POA: Diagnosis not present

## 2014-04-10 MED ORDER — CEFAZOLIN SODIUM-DEXTROSE 2-3 GM-% IV SOLR
2.0000 g | INTRAVENOUS | Status: AC
  Administered 2014-04-11: 2 g via INTRAVENOUS
  Filled 2014-04-10: qty 50

## 2014-04-10 NOTE — Anesthesia Preprocedure Evaluation (Addendum)
Anesthesia Evaluation  Patient identified by MRN, date of birth, ID band Patient awake    Reviewed: Allergy & Precautions, H&P , NPO status , Patient's Chart, lab work & pertinent test results  Airway Mallampati: II   Neck ROM: Full    Dental  (+) Teeth Intact, Dental Advisory Given   Pulmonary  breath sounds clear to auscultation        Cardiovascular Rhythm:Regular  EKG LVH   Neuro/Psych    GI/Hepatic   Endo/Other    Renal/GU      Musculoskeletal   Abdominal (+)  Abdomen: soft.    Peds  Hematology   Anesthesia Other Findings   Reproductive/Obstetrics                            Anesthesia Physical Anesthesia Plan  ASA: II  Anesthesia Plan: General   Post-op Pain Management:    Induction: Intravenous  Airway Management Planned: Oral ETT and Video Laryngoscope Planned  Additional Equipment:   Intra-op Plan:   Post-operative Plan:   Informed Consent: I have reviewed the patients History and Physical, chart, labs and discussed the procedure including the risks, benefits and alternatives for the proposed anesthesia with the patient or authorized representative who has indicated his/her understanding and acceptance.     Plan Discussed with:   Anesthesia Plan Comments: (CX mylopathy, Glide or stabilize head for intubation)        Anesthesia Quick Evaluation

## 2014-04-11 ENCOUNTER — Encounter (HOSPITAL_COMMUNITY)
Admission: RE | Disposition: A | Discharge status: Home / Self Care | Payer: Self-pay | Source: Ambulatory Visit | Attending: Neurological Surgery

## 2014-04-11 ENCOUNTER — Inpatient Hospital Stay (HOSPITAL_COMMUNITY)
Admission: RE | Admit: 2014-04-11 | Discharge: 2014-04-13 | DRG: 473 | Disposition: A | Discharge status: Home / Self Care | Payer: Medicare Other | Source: Ambulatory Visit | Attending: Neurological Surgery | Admitting: Neurological Surgery

## 2014-04-11 ENCOUNTER — Inpatient Hospital Stay (HOSPITAL_COMMUNITY): Payer: Medicare Other | Admitting: Anesthesiology

## 2014-04-11 ENCOUNTER — Encounter (HOSPITAL_COMMUNITY): Payer: Self-pay | Admitting: *Deleted

## 2014-04-11 ENCOUNTER — Inpatient Hospital Stay (HOSPITAL_COMMUNITY): Payer: Medicare Other

## 2014-04-11 DIAGNOSIS — Z886 Allergy status to analgesic agent status: Secondary | ICD-10-CM | POA: Diagnosis not present

## 2014-04-11 DIAGNOSIS — G890 Central pain syndrome: Secondary | ICD-10-CM | POA: Diagnosis present

## 2014-04-11 DIAGNOSIS — M4806 Spinal stenosis, lumbar region: Secondary | ICD-10-CM | POA: Diagnosis present

## 2014-04-11 DIAGNOSIS — M4722 Other spondylosis with radiculopathy, cervical region: Secondary | ICD-10-CM | POA: Diagnosis present

## 2014-04-11 DIAGNOSIS — M4712 Other spondylosis with myelopathy, cervical region: Secondary | ICD-10-CM | POA: Diagnosis present

## 2014-04-11 DIAGNOSIS — M5002 Cervical disc disorder with myelopathy, mid-cervical region: Secondary | ICD-10-CM | POA: Diagnosis not present

## 2014-04-11 DIAGNOSIS — Z91041 Radiographic dye allergy status: Secondary | ICD-10-CM

## 2014-04-11 DIAGNOSIS — M4322 Fusion of spine, cervical region: Secondary | ICD-10-CM

## 2014-04-11 DIAGNOSIS — M5012 Cervical disc disorder with radiculopathy, mid-cervical region: Secondary | ICD-10-CM | POA: Diagnosis not present

## 2014-04-11 DIAGNOSIS — M47812 Spondylosis without myelopathy or radiculopathy, cervical region: Secondary | ICD-10-CM | POA: Diagnosis not present

## 2014-04-11 DIAGNOSIS — G959 Disease of spinal cord, unspecified: Secondary | ICD-10-CM | POA: Diagnosis present

## 2014-04-11 HISTORY — PX: ANTERIOR CERVICAL DECOMP/DISCECTOMY FUSION: SHX1161

## 2014-04-11 SURGERY — ANTERIOR CERVICAL DECOMPRESSION/DISCECTOMY FUSION 2 LEVELS
Anesthesia: General

## 2014-04-11 MED ORDER — HYDROMORPHONE HCL 1 MG/ML IJ SOLN
0.5000 mg | INTRAMUSCULAR | Status: AC | PRN
  Administered 2014-04-11 (×4): 0.5 mg via INTRAVENOUS

## 2014-04-11 MED ORDER — ACETAMINOPHEN 10 MG/ML IV SOLN
INTRAVENOUS | Status: DC | PRN
  Administered 2014-04-11: 1000 mg via INTRAVENOUS

## 2014-04-11 MED ORDER — BISACODYL 10 MG RE SUPP: 10.0000 mg | Freq: Every day | RECTAL | Status: DC | PRN

## 2014-04-11 MED ORDER — LACTATED RINGERS IV SOLN
INTRAVENOUS | Status: DC
  Administered 2014-04-11: 11:00:00 via INTRAVENOUS

## 2014-04-11 MED ORDER — MENTHOL 3 MG MT LOZG: 1.0000 | LOZENGE | OROMUCOSAL | Status: DC | PRN

## 2014-04-11 MED ORDER — LACTATED RINGERS IV SOLN
INTRAVENOUS | Status: DC | PRN
  Administered 2014-04-11 (×2): via INTRAVENOUS

## 2014-04-11 MED ORDER — CEFAZOLIN SODIUM 1-5 GM-% IV SOLN
1.0000 g | Freq: Three times a day (TID) | INTRAVENOUS | Status: AC
  Administered 2014-04-11 – 2014-04-12 (×2): 1 g via INTRAVENOUS
  Filled 2014-04-11 (×2): qty 50

## 2014-04-11 MED ORDER — MORPHINE SULFATE 15 MG PO TABS
15.0000 mg | ORAL_TABLET | Freq: Every day | ORAL | Status: DC | PRN
  Administered 2014-04-13: 15 mg via ORAL
  Filled 2014-04-11: qty 1

## 2014-04-11 MED ORDER — SODIUM CHLORIDE 0.9 % IV SOLN
250.0000 mL | INTRAVENOUS | Status: DC
  Administered 2014-04-12: 250 mL via INTRAVENOUS

## 2014-04-11 MED ORDER — METHOCARBAMOL 1000 MG/10ML IJ SOLN
500.0000 mg | Freq: Four times a day (QID) | INTRAVENOUS | Status: DC | PRN
  Administered 2014-04-11: 500 mg via INTRAVENOUS
  Filled 2014-04-11 (×2): qty 5

## 2014-04-11 MED ORDER — POLYETHYLENE GLYCOL 3350 17 G PO PACK: 17.0000 g | PACK | Freq: Every day | ORAL | Status: DC | PRN

## 2014-04-11 MED ORDER — PHENOL 1.4 % MT LIQD: 1.0000 | OROMUCOSAL | Status: DC | PRN

## 2014-04-11 MED ORDER — METHOCARBAMOL 500 MG PO TABS
500.0000 mg | ORAL_TABLET | Freq: Four times a day (QID) | ORAL | Status: DC | PRN
  Administered 2014-04-12 (×2): 500 mg via ORAL
  Filled 2014-04-11 (×2): qty 1

## 2014-04-11 MED ORDER — DEXAMETHASONE SODIUM PHOSPHATE 10 MG/ML IJ SOLN
INTRAMUSCULAR | Status: DC | PRN
  Administered 2014-04-11: 10 mg via INTRAVENOUS

## 2014-04-11 MED ORDER — HYDROMORPHONE HCL 1 MG/ML IJ SOLN
0.5000 mg | INTRAMUSCULAR | Status: DC | PRN
  Administered 2014-04-11 (×2): 0.5 mg via INTRAVENOUS

## 2014-04-11 MED ORDER — GLYCOPYRROLATE 0.2 MG/ML IJ SOLN
INTRAMUSCULAR | Status: DC | PRN
  Administered 2014-04-11: 0.2 mg via INTRAVENOUS
  Administered 2014-04-11: .7 mg via INTRAVENOUS

## 2014-04-11 MED ORDER — FENTANYL CITRATE 0.05 MG/ML IJ SOLN
INTRAMUSCULAR | Status: AC
  Filled 2014-04-11: qty 5

## 2014-04-11 MED ORDER — SODIUM CHLORIDE 0.9 % IR SOLN
Status: DC | PRN
  Administered 2014-04-11: 500 mL

## 2014-04-11 MED ORDER — FENTANYL CITRATE 0.05 MG/ML IJ SOLN
25.0000 ug | INTRAMUSCULAR | Status: DC | PRN
  Administered 2014-04-11 (×2): 50 ug via INTRAVENOUS

## 2014-04-11 MED ORDER — 0.9 % SODIUM CHLORIDE (POUR BTL) OPTIME
TOPICAL | Status: DC | PRN
Start: 2014-04-11 — End: 2014-04-11
  Administered 2014-04-11: 1000 mL

## 2014-04-11 MED ORDER — ONDANSETRON HCL 4 MG/2ML IJ SOLN: 4.0000 mg | INTRAMUSCULAR | Status: DC | PRN

## 2014-04-11 MED ORDER — ACETAMINOPHEN 325 MG PO TABS
650.0000 mg | ORAL_TABLET | ORAL | Status: DC | PRN
  Administered 2014-04-12 (×2): 650 mg via ORAL

## 2014-04-11 MED ORDER — SENNA 8.6 MG PO TABS
1.0000 | ORAL_TABLET | Freq: Two times a day (BID) | ORAL | Status: DC
  Administered 2014-04-11: 8.6 mg via ORAL
  Filled 2014-04-11 (×2): qty 1

## 2014-04-11 MED ORDER — FENTANYL CITRATE 0.05 MG/ML IJ SOLN
INTRAMUSCULAR | Status: DC | PRN
  Administered 2014-04-11: 150 ug via INTRAVENOUS
  Administered 2014-04-11: 50 ug via INTRAVENOUS
  Administered 2014-04-11: 100 ug via INTRAVENOUS
  Administered 2014-04-11 (×2): 50 ug via INTRAVENOUS

## 2014-04-11 MED ORDER — KETAMINE HCL 10 MG/ML IJ SOLN
1.0000 mg/kg | INTRAMUSCULAR | Status: AC
  Administered 2014-04-11: 35 mg via INTRAVENOUS
  Filled 2014-04-11: qty 8.5

## 2014-04-11 MED ORDER — THROMBIN 5000 UNITS EX SOLR
CUTANEOUS | Status: DC | PRN
  Administered 2014-04-11 (×2): 5000 [IU] via TOPICAL

## 2014-04-11 MED ORDER — GEMFIBROZIL 600 MG PO TABS
600.0000 mg | ORAL_TABLET | Freq: Every day | ORAL | Status: DC
  Administered 2014-04-11 – 2014-04-13 (×3): 600 mg via ORAL
  Filled 2014-04-11 (×3): qty 1

## 2014-04-11 MED ORDER — HYDROMORPHONE HCL 1 MG/ML IJ SOLN
INTRAMUSCULAR | Status: AC
Start: 2014-04-11 — End: 2014-04-12
  Filled 2014-04-11: qty 2

## 2014-04-11 MED ORDER — NEOSTIGMINE METHYLSULFATE 10 MG/10ML IV SOLN
INTRAVENOUS | Status: DC | PRN
  Administered 2014-04-11: 4 mg via INTRAVENOUS

## 2014-04-11 MED ORDER — MIDAZOLAM HCL 2 MG/2ML IJ SOLN
INTRAMUSCULAR | Status: AC
  Filled 2014-04-11: qty 2

## 2014-04-11 MED ORDER — DIAZEPAM 5 MG/ML IJ SOLN
INTRAMUSCULAR | Status: AC
  Filled 2014-04-11: qty 2

## 2014-04-11 MED ORDER — CLONAZEPAM 1 MG PO TABS
1.0000 mg | ORAL_TABLET | Freq: Two times a day (BID) | ORAL | Status: DC | PRN
  Administered 2014-04-11 – 2014-04-13 (×2): 1 mg via ORAL
  Filled 2014-04-11 (×2): qty 1

## 2014-04-11 MED ORDER — FENTANYL CITRATE 0.05 MG/ML IJ SOLN
INTRAMUSCULAR | Status: AC
  Filled 2014-04-11: qty 4

## 2014-04-11 MED ORDER — MIDAZOLAM HCL 5 MG/5ML IJ SOLN
INTRAMUSCULAR | Status: DC | PRN
  Administered 2014-04-11: 2 mg via INTRAVENOUS

## 2014-04-11 MED ORDER — ROCURONIUM BROMIDE 100 MG/10ML IV SOLN
INTRAVENOUS | Status: DC | PRN
  Administered 2014-04-11: 10 mg via INTRAVENOUS
  Administered 2014-04-11: 40 mg via INTRAVENOUS

## 2014-04-11 MED ORDER — ONDANSETRON HCL 4 MG/2ML IJ SOLN
INTRAMUSCULAR | Status: DC | PRN
  Administered 2014-04-11: 4 mg via INTRAVENOUS

## 2014-04-11 MED ORDER — SODIUM CHLORIDE 0.9 % IJ SOLN
3.0000 mL | Freq: Two times a day (BID) | INTRAMUSCULAR | Status: DC
  Administered 2014-04-12 – 2014-04-13 (×2): 3 mL via INTRAVENOUS

## 2014-04-11 MED ORDER — SODIUM CHLORIDE 0.9 % IJ SOLN: 3.0000 mL | INTRAMUSCULAR | Status: DC | PRN

## 2014-04-11 MED ORDER — MORPHINE SULFATE ER 15 MG PO TBCR
60.0000 mg | EXTENDED_RELEASE_TABLET | Freq: Three times a day (TID) | ORAL | Status: DC
  Administered 2014-04-11 – 2014-04-13 (×6): 60 mg via ORAL
  Filled 2014-04-11 (×4): qty 4

## 2014-04-11 MED ORDER — PROPOFOL 10 MG/ML IV BOLUS
INTRAVENOUS | Status: AC
  Filled 2014-04-11: qty 20

## 2014-04-11 MED ORDER — FLEET ENEMA 7-19 GM/118ML RE ENEM: 1.0000 | ENEMA | Freq: Once | RECTAL | Status: AC | PRN

## 2014-04-11 MED ORDER — MORPHINE SULFATE 2 MG/ML IJ SOLN
1.0000 mg | INTRAMUSCULAR | Status: DC | PRN
  Administered 2014-04-11 – 2014-04-12 (×2): 4 mg via INTRAVENOUS
  Administered 2014-04-12 (×2): 2 mg via INTRAVENOUS
  Administered 2014-04-12: 4 mg via INTRAVENOUS
  Administered 2014-04-12: 2 mg via INTRAVENOUS
  Filled 2014-04-11 (×2): qty 1

## 2014-04-11 MED ORDER — ALUM & MAG HYDROXIDE-SIMETH 200-200-20 MG/5ML PO SUSP: 30.0000 mL | Freq: Four times a day (QID) | ORAL | Status: DC | PRN

## 2014-04-11 MED ORDER — HYDROCODONE-ACETAMINOPHEN 5-325 MG PO TABS: 1.0000 | ORAL_TABLET | ORAL | Status: DC | PRN

## 2014-04-11 MED ORDER — OXYCODONE-ACETAMINOPHEN 5-325 MG PO TABS
1.0000 | ORAL_TABLET | ORAL | Status: DC | PRN
  Administered 2014-04-13 (×2): 2 via ORAL
  Filled 2014-04-11 (×2): qty 2

## 2014-04-11 MED ORDER — DIAZEPAM 5 MG/ML IJ SOLN: 5.0000 mg | Freq: Four times a day (QID) | INTRAMUSCULAR | Status: DC | PRN

## 2014-04-11 MED ORDER — DOCUSATE SODIUM 100 MG PO CAPS
100.0000 mg | ORAL_CAPSULE | Freq: Two times a day (BID) | ORAL | Status: DC
  Administered 2014-04-11: 100 mg via ORAL
  Filled 2014-04-11 (×2): qty 1

## 2014-04-11 MED ORDER — PROMETHAZINE HCL 25 MG/ML IJ SOLN: 6.2500 mg | INTRAMUSCULAR | Status: DC | PRN

## 2014-04-11 MED ORDER — HEMOSTATIC AGENTS (NO CHARGE) OPTIME
TOPICAL | Status: DC | PRN
  Administered 2014-04-11: 1 via TOPICAL

## 2014-04-11 MED ORDER — HYDROMORPHONE HCL 1 MG/ML IJ SOLN
INTRAMUSCULAR | Status: AC
  Filled 2014-04-11: qty 1

## 2014-04-11 MED ORDER — ACETAMINOPHEN 650 MG RE SUPP: 650.0000 mg | RECTAL | Status: DC | PRN

## 2014-04-11 MED ORDER — PROPOFOL 10 MG/ML IV BOLUS
INTRAVENOUS | Status: DC | PRN
  Administered 2014-04-11: 120 mg via INTRAVENOUS

## 2014-04-11 MED ORDER — PREGABALIN 75 MG PO CAPS
150.0000 mg | ORAL_CAPSULE | Freq: Every evening | ORAL | Status: DC | PRN
  Filled 2014-04-11: qty 2

## 2014-04-11 MED ORDER — MEPERIDINE HCL 25 MG/ML IJ SOLN: 6.2500 mg | INTRAMUSCULAR | Status: DC | PRN

## 2014-04-11 MED ORDER — IRBESARTAN 300 MG PO TABS
300.0000 mg | ORAL_TABLET | Freq: Every day | ORAL | Status: DC
  Administered 2014-04-11 – 2014-04-13 (×3): 300 mg via ORAL
  Filled 2014-04-11: qty 2

## 2014-04-11 SURGICAL SUPPLY — 53 items
ADH SKN CLS LQ APL DERMABOND (GAUZE/BANDAGES/DRESSINGS) ×1
ALLOGRAFT LORDOTIC CC 7X11X14 (Bone Implant) ×4 IMPLANT
BAG DECANTER FOR FLEXI CONT (MISCELLANEOUS) ×3 IMPLANT
BIT DRILL NEURO 2X3.1 SFT TUCH (MISCELLANEOUS) ×1 IMPLANT
BNDG GAUZE ELAST 4 BULKY (GAUZE/BANDAGES/DRESSINGS) IMPLANT
BUR BARREL STRAIGHT FLUTE 4.0 (BURR) ×3 IMPLANT
CANISTER SUCT 3000ML (MISCELLANEOUS) ×3 IMPLANT
CONT SPEC 4OZ CLIKSEAL STRL BL (MISCELLANEOUS) ×3 IMPLANT
DECANTER SPIKE VIAL GLASS SM (MISCELLANEOUS) ×3 IMPLANT
DERMABOND ADHESIVE PROPEN (GAUZE/BANDAGES/DRESSINGS) ×2
DERMABOND ADVANCED .7 DNX6 (GAUZE/BANDAGES/DRESSINGS) IMPLANT
DRAPE LAPAROTOMY 100X72 PEDS (DRAPES) ×3 IMPLANT
DRAPE MICROSCOPE LEICA (MISCELLANEOUS) IMPLANT
DRAPE POUCH INSTRU U-SHP 10X18 (DRAPES) ×3 IMPLANT
DRILL NEURO 2X3.1 SOFT TOUCH (MISCELLANEOUS) ×3
DURAPREP 6ML APPLICATOR 50/CS (WOUND CARE) ×3 IMPLANT
ELECT REM PT RETURN 9FT ADLT (ELECTROSURGICAL) ×3
ELECTRODE REM PT RTRN 9FT ADLT (ELECTROSURGICAL) ×1 IMPLANT
GAUZE SPONGE 4X4 12PLY STRL (GAUZE/BANDAGES/DRESSINGS) ×3 IMPLANT
GAUZE SPONGE 4X4 16PLY XRAY LF (GAUZE/BANDAGES/DRESSINGS) IMPLANT
GLOVE BIOGEL PI IND STRL 8.5 (GLOVE) ×1 IMPLANT
GLOVE BIOGEL PI INDICATOR 8.5 (GLOVE) ×2
GLOVE ECLIPSE 8.5 STRL (GLOVE) ×3 IMPLANT
GLOVE EXAM NITRILE LRG STRL (GLOVE) IMPLANT
GLOVE EXAM NITRILE MD LF STRL (GLOVE) IMPLANT
GLOVE EXAM NITRILE XL STR (GLOVE) IMPLANT
GLOVE EXAM NITRILE XS STR PU (GLOVE) IMPLANT
GOWN STRL REUS W/ TWL LRG LVL3 (GOWN DISPOSABLE) IMPLANT
GOWN STRL REUS W/ TWL XL LVL3 (GOWN DISPOSABLE) IMPLANT
GOWN STRL REUS W/TWL 2XL LVL3 (GOWN DISPOSABLE) ×3 IMPLANT
GOWN STRL REUS W/TWL LRG LVL3 (GOWN DISPOSABLE)
GOWN STRL REUS W/TWL XL LVL3 (GOWN DISPOSABLE)
HALTER HD/CHIN CERV TRACTION D (MISCELLANEOUS) ×3 IMPLANT
KIT BASIN OR (CUSTOM PROCEDURE TRAY) ×3 IMPLANT
KIT ROOM TURNOVER OR (KITS) ×3 IMPLANT
LIQUID BAND (GAUZE/BANDAGES/DRESSINGS) ×3 IMPLANT
NDL SPNL 22GX3.5 QUINCKE BK (NEEDLE) ×1 IMPLANT
NEEDLE HYPO 22GX1.5 SAFETY (NEEDLE) ×3 IMPLANT
NEEDLE SPNL 22GX3.5 QUINCKE BK (NEEDLE) ×3 IMPLANT
NS IRRIG 1000ML POUR BTL (IV SOLUTION) ×3 IMPLANT
PACK LAMINECTOMY NEURO (CUSTOM PROCEDURE TRAY) ×3 IMPLANT
PAD ARMBOARD 7.5X6 YLW CONV (MISCELLANEOUS) ×9 IMPLANT
PLATE ARCHON 1-LEVEL 22MM (Plate) ×4 IMPLANT
RUBBERBAND STERILE (MISCELLANEOUS) IMPLANT
SCREW ARCHON ST VAR 4.0X15MM (Screw) ×24 IMPLANT
SCREW BN 15X4XST VA NS SPN (Screw) IMPLANT
SPONGE INTESTINAL PEANUT (DISPOSABLE) ×3 IMPLANT
SPONGE SURGIFOAM ABS GEL SZ50 (HEMOSTASIS) ×3 IMPLANT
SUT VIC AB 3-0 SH 8-18 (SUTURE) ×6 IMPLANT
SYR 20ML ECCENTRIC (SYRINGE) ×3 IMPLANT
TOWEL OR 17X24 6PK STRL BLUE (TOWEL DISPOSABLE) ×3 IMPLANT
TOWEL OR 17X26 10 PK STRL BLUE (TOWEL DISPOSABLE) ×3 IMPLANT
WATER STERILE IRR 1000ML POUR (IV SOLUTION) ×3 IMPLANT

## 2014-04-11 NOTE — Progress Notes (Signed)
Patient arrived to 4N18 from PACU. Patient in pleasant mood and only complains of mild pain in the back of his neck. Family at bedside. Will continue to monitor patient closely. Monia PouchShakenna Elicia Lui, RN

## 2014-04-11 NOTE — Transfer of Care (Signed)
Immediate Anesthesia Transfer of Care Note  Patient: Dennis Cochran  Procedure(s) Performed: Procedure(s): Cervical four/five - Cervical six/seven cervical decompression/diskectomy/fusion (N/A)  Patient Location: PACU  Anesthesia Type:General  Level of Consciousness: awake, alert  and oriented  Airway & Oxygen Therapy: Patient Spontanous Breathing and Patient connected to nasal cannula oxygen  Post-op Assessment: Report given to PACU RN and Post -op Vital signs reviewed and stable  Post vital signs: Reviewed and stable  Complications: No apparent anesthesia complications

## 2014-04-11 NOTE — Anesthesia Procedure Notes (Addendum)
Procedure Name: Intubation Date/Time: 04/11/2014 2:22 PM Performed by: Eligha Bridegroom Pre-anesthesia Checklist: Patient identified, Timeout performed, Emergency Drugs available, Suction available and Patient being monitored Patient Re-evaluated:Patient Re-evaluated prior to inductionOxygen Delivery Method: Circle system utilized Preoxygenation: Pre-oxygenation with 100% oxygen Intubation Type: IV induction Ventilation: Mask ventilation without difficulty Grade View: Grade III Tube type: Oral Tube size: 7.5 mm Airway Equipment and Method: LTA kit utilized and Video-laryngoscopy Placement Confirmation: ETT inserted through vocal cords under direct vision,  breath sounds checked- equal and bilateral and positive ETCO2 Secured at: 22 cm Tube secured with: Tape Dental Injury: Teeth and Oropharynx as per pre-operative assessment  Difficulty Due To: Difficult Airway- due to reduced neck mobility and Difficult Airway- due to anterior larynx
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
## 2014-04-12 DIAGNOSIS — Z886 Allergy status to analgesic agent status: Secondary | ICD-10-CM | POA: Diagnosis not present

## 2014-04-12 DIAGNOSIS — M4806 Spinal stenosis, lumbar region: Secondary | ICD-10-CM | POA: Diagnosis present

## 2014-04-12 DIAGNOSIS — M4712 Other spondylosis with myelopathy, cervical region: Secondary | ICD-10-CM | POA: Diagnosis present

## 2014-04-12 DIAGNOSIS — G890 Central pain syndrome: Secondary | ICD-10-CM | POA: Diagnosis present

## 2014-04-12 DIAGNOSIS — Z91041 Radiographic dye allergy status: Secondary | ICD-10-CM | POA: Diagnosis not present

## 2014-04-12 DIAGNOSIS — M4722 Other spondylosis with radiculopathy, cervical region: Secondary | ICD-10-CM | POA: Diagnosis present

## 2014-04-12 NOTE — Progress Notes (Signed)
UR completed 

## 2014-04-12 NOTE — Evaluation (Signed)
Physical Therapy Evaluation Patient Details Name: Dennis Cochran MRN: [id number] DOB: 1951/02/12 Today's Date: 04/12/2014   History of Present Illness  Pt presents with severe spondylitic stenosis at C4-5 and again at C6-C7 above previous decompression fusion at C5-C6.  He underwent decompression and stabilization sx 04-11-14.   Clinical Impression  Patient is s/p above surgery resulting in the deficits listed below (see PT Problem List).  Patient will benefit from skilled PT to increase their independence and safety with mobility (while adhering to their precautions) to allow discharge home.      Follow Up Recommendations No PT follow up;Supervision for mobility/OOB    Equipment Recommendations  None recommended by PT    Recommendations for Other Services       Precautions / Restrictions Precautions Precautions: Fall;Cervical Restrictions Weight Bearing Restrictions: No      Mobility  Bed Mobility Overal bed mobility: Needs Assistance Bed Mobility: Supine to Sit     Supine to sit: Min assist     General bed mobility comments: assist with BLE, use of bedrail  Transfers Overall transfer level: Needs assistance Equipment used: None Transfers: Stand Pivot Transfers   Stand pivot transfers: Min assist       General transfer comment: verbal cues for sequencing and safety  Ambulation/Gait             General Gait Details: pt unable to ambulate today due to pain  Stairs            Wheelchair Mobility    Modified Rankin (Stroke Patients Only)       Balance                                             Pertinent Vitals/Pain Pain Assessment: 0-10 Pain Score: 10-Worst pain ever Pain Location: hands, feet and buttocks Pain Descriptors / Indicators: Burning Pain Intervention(s): Limited activity within patient's tolerance;Repositioned    Home Living Family/patient expects to be discharged to:: Private residence Living Arrangements:  Alone Available Help at Discharge: Friend(s);Available PRN/intermittently Type of Home: House Home Access: Level entry     Home Layout: One level Home Equipment: Wheelchair - power;Wheelchair - Careers advisermanual;Other (comment) (loftstrand crutch)      Prior Function Level of Independence: Independent with assistive device(s)         Comments: w/c in community and loftstrand crutch in home     Hand Dominance   Dominant Hand: Right    Extremity/Trunk Assessment   Upper Extremity Assessment: Defer to OT evaluation           Lower Extremity Assessment: Difficult to assess due to impaired cognition      Cervical / Trunk Assessment: Normal  Communication   Communication: No difficulties  Cognition Arousal/Alertness: Awake/alert Behavior During Therapy: WFL for tasks assessed/performed Overall Cognitive Status: Within Functional Limits for tasks assessed                      General Comments      Exercises        Assessment/Plan    PT Assessment Patient needs continued PT services  PT Diagnosis Difficulty walking;Generalized weakness;Acute pain   PT Problem List Decreased strength;Decreased activity tolerance;Decreased balance;Decreased mobility;Decreased safety awareness;Pain  PT Treatment Interventions Gait training;DME instruction;Functional mobility training;Therapeutic activities;Patient/family education;Balance training   PT Goals (Current goals can be found in the Care Plan  section) Acute Rehab PT Goals Patient Stated Goal: home PT Goal Formulation: With patient Time For Goal Achievement: 04/19/14 Potential to Achieve Goals: Good    Frequency Min 5X/week   Barriers to discharge        Co-evaluation               End of Session Equipment Utilized During Treatment: Gait belt Activity Tolerance: Patient limited by pain Patient left: in chair;with call bell/phone within reach;with chair alarm set Nurse Communication: Mobility status          Time: [id number] PT Time Calculation (min) (ACUTE ONLY): 20 min   Charges:   PT Evaluation $Initial PT Evaluation Tier I: 1 Procedure PT Treatments $Therapeutic Activity: 8-22 mins   PT G Codes:          Ilda FoilGarrow, Dennis Cochran Rene 04/12/2014, 10:28 AM

## 2014-04-12 NOTE — Evaluation (Signed)
Occupational Therapy Evaluation Patient Details Name: Rudene ChristiansKent M Mott MRN: [id number] DOB: [birth year] Today's Date: 04/12/2014    History of Present Illness Pt presents with severe spondylitic stenosis at C4-5 and again at C6-C7 above previous decompression fusion at C5-C6.  He underwent decompression and stabilization sx 04-11-14.    Clinical Impression   Pt s/p above. Pt independent with ADLs, PTA. Feel pt will benefit from acute OT to increase independence with BADLs and to address fine motor coordination and hand strength prior to d/c. Recommending Outpatient OT.     Follow Up Recommendations  Outpatient OT; Supervision-Intermittent   Equipment Recommendations  Other (comment) (AE)    Recommendations for Other Services       Precautions / Restrictions Precautions Precautions: Fall;Cervical Precaution Comments: educated on precautions Restrictions Weight Bearing Restrictions: No      Mobility Bed Mobility               General bed mobility comments: not assessed  Transfers Overall transfer level: Needs assistance   Transfers: Sit to/from Stand Sit to Stand: Min guard         General transfer comment: cues for hand placement.    Balance                                            ADL Overall ADL's : Needs assistance/impaired     Grooming: Wash/dry face;Wash/dry hands;Oral care;Applying deodorant;Standing;Moderate assistance (Mod A to assist with upright posture)   Upper Body Bathing: Set up;Supervision/ safety;Sitting           Lower Body Dressing: Minimal assistance;Sit to/from stand   Toilet Transfer: Min guard;Ambulation;RW;BSC           Functional mobility during ADLs: Min guard;Rolling walker General ADL Comments: Discussed using straw for liquids or filling cup up with more fluid to prevent a lot of neck motion. Discussed using button up shirts. Discussed incorporating cervical precautions into functional activities. Pt  ambulated to bathroom and performed grooming tasks as well as UB bathing under armpits. Pt able to cross legs over knees, but pt felt he was having to lift quite a bit of weight, so educated on AE and where he could purchase.  Discussed alternative LB dressing technique in supine. Discussed being careful around sharp/dangerous objects/hot water due to sensation issues in hands. Recommended using shower chair for LB bathing.      Vision                     Perception     Praxis      Pertinent Vitals/Pain Pain Assessment: 0-10 Pain Score:  (7 in legs/feet and 5 in posterior neck) Pain Location: legs/feet and posterior neck Pain Descriptors / Indicators: Burning Pain Intervention(s): Monitored during session     Hand Dominance Right   Extremity/Trunk Assessment Upper Extremity Assessment Upper Extremity Assessment: RUE deficits/detail;LUE deficits/detail RUE Sensation: decreased light touch RUE Coordination: decreased fine motor LUE Deficits / Details: weakness in 4-5th digits LUE Sensation: decreased light touch LUE Coordination: decreased fine motor (more impaired than right hand)   Lower Extremity Assessment Lower Extremity Assessment: Defer to PT evaluation       Communication Communication Communication: No difficulties   Cognition Arousal/Alertness: Awake/alert Behavior During Therapy: WFL for tasks assessed/performed Overall Cognitive Status: Within Functional Limits for tasks assessed  General Comments       Exercises Exercises: Other exercises Other Exercises Other Exercises: educated on fine motor coordination exercises and encouraged pt to be using left hand for functional activities as left hand seems more impaired.    Shoulder Instructions      Home Living Family/patient expects to be discharged to:: Private residence Living Arrangements: Alone Available Help at Discharge: Friend(s);Available PRN/intermittently Type  of Home: House Home Access: Level entry     Home Layout: One level     Bathroom Shower/Tub: Tub only;Walk-in shower         Home Equipment: Wheelchair - power;Wheelchair - Careers advisermanual;Other (comment);Tub bench (loftstrand crutch)          Prior Functioning/Environment Level of Independence: Independent with assistive device(s)        Comments: w/c in community and loftstrand crutch in home    OT Diagnosis: Acute pain   OT Problem List: Decreased strength;Impaired balance (sitting and/or standing);Pain;Impaired UE functional use;Impaired sensation;Decreased knowledge of use of DME or AE;Decreased knowledge of precautions;Decreased coordination   OT Treatment/Interventions: Self-care/ADL training;Therapeutic exercise;DME and/or AE instruction;Therapeutic activities;Patient/family education;Balance training    OT Goals(Current goals can be found in the care plan section) Acute Rehab OT Goals Patient Stated Goal: not stated OT Goal Formulation: With patient Time For Goal Achievement: 04/19/14 Potential to Achieve Goals: Good ADL Goals Pt Will Perform Lower Body Dressing: with set-up;with supervision;sit to/from stand Pt Will Transfer to Toilet: with supervision;ambulating Additional ADL Goal #1: Pt will be independent with HEP for hands to increase strength and coordination.  OT Frequency: Min 2X/week   Barriers to D/C:            Co-evaluation              End of Session Equipment Utilized During Treatment: Gait belt;Rolling walker  Activity Tolerance: Patient limited by pain;Patient limited by fatigue Patient left: in chair;with call bell/phone within reach   Time: 1536-1619 OT Time Calculation (min): 43 min Charges:  OT General Charges $OT Visit: 1 Procedure OT Evaluation $Initial OT Evaluation Tier I: 1 Procedure OT Treatments $Self Care/Home Management : 8-22 mins G-CodesEarlie Raveling:    Kaide Gage L OTR/L [phone] 04/12/2014, 6:18 PM

## 2014-04-12 NOTE — Progress Notes (Signed)
Patient ID: Dennis ChristiansKent M Eimers, male   DOB: April 05, 1951, 63 y.o.   MRN: [id number] Afeb, vss Complains of a lot of neck pain. Will try to increase activity, and plan d/c when his pain is controlled.

## 2014-04-13 NOTE — Progress Notes (Signed)
CARE MANAGEMENT NOTE 04/13/2014  Patient:  Rudene ChristiansSMITH,Easter M   Account Number:  [id number]  Date Initiated:  04/13/2014  Documentation initiated by:  [facility]Ellie Bryand  Subjective/Objective Assessment:   decompression and fusion C 45 and C 67 levels     Action/Plan:   Anticipated DC Date:  04/13/2014   Anticipated DC Plan:  HOME/SELF CARE      DC Planning Services  CM consult      Choice offered to / List presented to:             Status of service:  Completed, signed off Medicare Important Message given?  NA - LOS <3 / Initial given by admissions (If response is "NO", the following Medicare IM given date fields will be blank) Date Medicare IM given:   Medicare IM given by:   Date Additional Medicare IM given:   Additional Medicare IM given by:    Discharge Disposition:  HOME/SELF CARE  Per UR Regulation:  Reviewed for med. necessity/level of care/duration of stay  If discussed at Long Length of Stay Meetings, dates discussed:    Comments:  No NCM needs identified. No PT follow up needed. Isidoro DonningAlesia Rece Zechman RN CCM Case Mgmt phone [phone]

## 2014-04-13 NOTE — Plan of Care (Signed)
Problem: Phase I Progression Outcomes Goal: Pain controlled with appropriate interventions Outcome: Progressing Goal: OOB as tolerated unless otherwise ordered Outcome: Completed/Met Date Met:  04/13/14 Goal: Log roll for position change Outcome: Completed/Met Date Met:  04/13/14 Goal: Initial discharge plan identified Outcome: Completed/Met Date Met:  04/13/14 Goal: PT/OT consults requested Outcome: Completed/Met Date Met:  04/13/14 Goal: Hemodynamically stable Outcome: Completed/Met Date Met:  04/13/14  Problem: Phase II Progression Outcomes Goal: Progress activity as tolerated unless otherwise ordered Outcome: Completed/Met Date Met:  04/13/14 Goal: Tolerating diet Outcome: Completed/Met Date Met:  04/13/14 Goal: Verbalizes of donning/doffing brace Outcome: Not Applicable Date Met:  28/20/60 Goal: Understands assist devices with ambulation Outcome: Completed/Met Date Met:  04/13/14 Goal: PT/OT consults completed Outcome: Completed/Met Date Met:  04/13/14

## 2014-04-13 NOTE — Plan of Care (Signed)
Problem: Phase III Progression Outcomes Goal: Pain controlled on oral analgesia Outcome: Adequate for Discharge Goal: Activity at appropriate level-compared to baseline (UP IN CHAIR FOR HEMODIALYSIS)  Outcome: Adequate for Discharge Goal: Demonstrates donning/doffing brace Outcome: Not Applicable Date Met:  61/51/83 Goal: Demonstrates proper use of assistive devices Outcome: Completed/Met Date Met:  04/13/14 Goal: Discharge plan remains appropriate-arrangements made Outcome: Completed/Met Date Met:  04/13/14 Goal: Other Phase III Outcomes/Goals Outcome: Not Applicable Date Met:  43/73/57  Problem: Discharge Progression Outcomes Goal: Barriers To Progression Addressed/Resolved Outcome: Not Applicable Date Met:  89/78/47 Goal: Discharge plan in place and appropriate Outcome: Completed/Met Date Met:  04/13/14 Goal: Pain controlled with appropriate interventions Outcome: Adequate for Discharge Goal: Hemodynamically stable Outcome: Completed/Met Date Met:  84/12/82 Goal: Complications resolved/controlled Outcome: Completed/Met Date Met:  04/13/14 Goal: Tolerates diet Outcome: Completed/Met Date Met:  04/13/14 Goal: Incision without S/S infection Outcome: Completed/Met Date Met:  04/13/14 Goal: Ambulates without assistance Outcome: Adequate for Discharge Goal: Demonstrates proper use of assistive devices Outcome: Completed/Met Date Met:  04/13/14 Goal: Other Discharge Outcomes/Goals Outcome: Not Applicable Date Met:  12/20/86

## 2014-04-13 NOTE — Progress Notes (Signed)
DC instructions given and patient verbalized understanding. 

## 2014-04-13 NOTE — Discharge Summary (Signed)
Physician Discharge Summary  Patient ID: Dennis Cochran MRN: [id number] DOB/AGE: 1950/12/07 63 y.o.  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 rate 20, height 5\' 8"  (1.727 m), weight 85.049 kg (187 lb 8 oz), SpO2 96 %. Neurologic: Alert and oriented X 3, normal strength and tone. Normal symmetric reflexes. Normal coordination and gait Wound:CDI  Disposition: Home     Medication List    TAKE these medications        EDARBI 80 MG Tabs  Generic drug:  Azilsartan Medoxomil  Take 80 mg by mouth daily.     gemfibrozil 600 MG tablet  Commonly known as:  LOPID  Take 600 mg by mouth daily.     KLONOPIN 1 MG tablet  Generic drug:  clonazePAM  Take 1 mg by mouth 2 (two) times daily as needed for anxiety.     morphine 60 MG 12 hr tablet  Commonly known as:  MS CONTIN  Take 60 mg by mouth 3 (three) times daily.     morphine 15 MG tablet  Commonly known as:  MSIR  Take 15 mg by mouth daily as needed for severe pain.     pregabalin 150 MG capsule  Commonly known as:  LYRICA  Take 150 mg by mouth at bedtime as needed (Orders from [place]).     testosterone enanthate 200 MG/ML injection  Commonly known as:  DELATESTRYL  Inject into the muscle every 14 (fourteen) days.         Signed: Dorian HeckleSTERN,Rishawn Walck D, MD 04/13/2014, 12:29 PM

## 2014-04-13 NOTE — Progress Notes (Signed)
Subjective: Patient reports doing better and wants to gohome  Objective: Vital signs in last 24 hours: Temp:  [97.5 F (36.4 C)-98.7 F (37.1 C)] 97.5 F (36.4 C) (12/06 1002) Pulse Rate:  [71-82] 82 (12/06 1002) Resp:  [20] 20 (12/06 1002) BP: (127-148)/(63-86) 138/83 mmHg (12/06 1002) SpO2:  [91 %-97 %] 96 % (12/06 1002)  Intake/Output from previous day: 12/05 0701 - 12/06 0700 In: 240 [P.O.:240] Out: 700 [Urine:700] Intake/Output this shift:    Physical Exam: Strength full.essing CDI.  Quite spastic in legs (at baseline).  Lab Results: No results for input(s): WBC, HGB, HCT, PLT in the last 72 hours. BMET No results for input(s): NA, K, CL, CO2, GLUCOSE, BUN, CREATININE, CALCIUM in the last 72 hours.  Studies/Results: Dg Cervical Spine 2-3 Views  04/11/2014   CLINICAL DATA:  ACDF C4-5 and C6-7.  EXAM: CERVICAL SPINE - 2-3 VIEW  COMPARISON:  MR cervical spine 03/09/2014.  FINDINGS: Two intraoperative cross-table lateral views of the cervical spine are submitted. The first image, taken at 1432 hr, shows a surgical instrument tip projecting along the anterior C4-5 interspace.  The second image, taken at 1611 hr, shows C4-5 anterior cervical fusion with interbody graft. C6-7 anterior plate and screws are partially imaged.  IMPRESSION: Intraoperative localization and visualization of C4-5 and C6-7 anterior cervical fusion.   Electronically Signed   By: Leanna BattlesMelinda  Blietz M.D.   On: 04/11/2014 16:57    Assessment/Plan: D/C home.  On home pain meds per Dr. Haskel Khanauck.    LOS: 2 days    Dorian HeckleSTERN,Erandi Lemma D, MD 04/13/2014, 12:22 PM

## 2014-04-13 NOTE — Progress Notes (Signed)
Physical Therapy Treatment Patient Details Name: Rudene ChristiansKent M Barajas MRN: [id number] DOB: Mar 06, 1951 Today's Date: 04/13/2014    History of Present Illness Pt presents with severe spondylitic stenosis at C4-5 and again at C6-C7 above previous decompression fusion at C5-C6.  He underwent decompression and stabilization sx 04-11-14.     PT Comments    Pt progressing well with mobility/PT goals.  Cont's to c/o "burning" pain from waist down which is chronic per pt.  Increased tx time due to pt very talkative.  Pt reports he feels he's at baseline with mobility & very eager to d/c home.      Follow Up Recommendations  No PT follow up;Supervision for mobility/OOB     Equipment Recommendations  None recommended by PT    Recommendations for Other Services       Precautions / Restrictions Precautions Precautions: Fall;Cervical Precaution Comments: educated on precautions Restrictions Weight Bearing Restrictions: No    Mobility  Bed Mobility               General bed mobility comments: pt sitting in recliner upon arrival  Transfers Overall transfer level: Needs assistance Equipment used: Rolling walker (2 wheeled) Transfers: Sit to/from Stand Sit to Stand: Supervision         General transfer comment: cues for hand placement  Ambulation/Gait Ambulation/Gait assistance: Supervision Ambulation Distance (Feet): 300 Feet Assistive device: Rolling walker (2 wheeled) Gait Pattern/deviations: Step-through pattern;Narrow base of support     General Gait Details: heavy reliance on RW with UE's.  Pt states he's moving as well as he did prior to surgery    Stairs            Wheelchair Mobility    Modified Rankin (Stroke Patients Only)       Balance                                    Cognition Arousal/Alertness: Awake/alert Behavior During Therapy: WFL for tasks assessed/performed Overall Cognitive Status: Within Functional Limits for tasks  assessed                      Exercises      General Comments        Pertinent Vitals/Pain Pain Assessment: 0-10 Pain Score:  ("on fire") Pain Location: below waist Pain Descriptors / Indicators: Burning Pain Intervention(s): Monitored during session;Premedicated before session;Repositioned    Home Living                      Prior Function            PT Goals (current goals can now be found in the care plan section) Acute Rehab PT Goals Patient Stated Goal: to go home today PT Goal Formulation: With patient Time For Goal Achievement: 04/19/14 Potential to Achieve Goals: Good Progress towards PT goals: Progressing toward goals    Frequency  Min 5X/week    PT Plan Current plan remains appropriate    Co-evaluation             End of Session   Activity Tolerance: Patient tolerated treatment well Patient left: in chair;with call bell/phone within reach     Time: 0931-0956 PT Time Calculation (min) (ACUTE ONLY): 25 min  Charges:  $Gait Training: 8-22 mins $Self Care/Home Management: 8-22  G Codes:      Lara MulchCooper, Zenaida Tesar Lynn 04/13/2014, 10:11 AM   Verdell FaceKelly Halden Phegley, PTA [phone] 04/13/2014

## 2014-04-14 ENCOUNTER — Encounter (HOSPITAL_COMMUNITY): Payer: Self-pay | Admitting: Neurological Surgery

## 2014-04-15 NOTE — Anesthesia Postprocedure Evaluation (Signed)
Anesthesia Post Note  Patient: Dennis Cochran  Procedure(s) Performed: Procedure(s) (LRB): Cervical four/five - Cervical six/seven cervical decompression/diskectomy/fusion (N/A)  Anesthesia type: General  Patient location: PACU  Post pain: Pain level controlled and Adequate analgesia  Post assessment: Post-op Vital signs reviewed, Patient's Cardiovascular Status Stable, Respiratory Function Stable, Patent Airway and Pain level controlled  Last Vitals:  Filed Vitals:   04/13/14 1002  BP: 138/83  Pulse: 82  Temp: 36.4 C  Resp: 20    Post vital signs: Reviewed and stable  Level of consciousness: awake, alert  and oriented  Complications: No apparent anesthesia complications

## 2014-04-23 DIAGNOSIS — E291 Testicular hypofunction: Secondary | ICD-10-CM | POA: Diagnosis not present

## 2014-05-07 NOTE — Op Note (Signed)
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 Asst.:Stern, MD Indications:Paitent has previous history of severe myelopathy due to spondylosis with compression at C5-6. He had a fusion there. Now has compressive disease at C4-5 and C6-7. Procedure: The patient was brought to the operating room placed on the table in supine position. After the smooth induction of general endotracheal anesthesia neck was placed in 5 pounds of halter traction and prepped with alcohol and DuraPrep. After sterile draping and appropriate timeout procedure a transverse incision was created in the left side of the neck and carried down to the platysma. The plane between the sternocleidomastoid and strap muscles dissected bluntly until the prevertebral space was reached. The first identifiable disc space was noted to be C4-5 on a localizing radiograph. The dissection was then undertaken in the longus coli muscle to allow placement of a self-retaining Caspar type retractor.  The anterior longitudinal ligament was opened at C4-5 and ventral osteophytes were removed with a Leksell rongeur and Kerrison punch. Interspace was cleared of significant quantity of the degenerated disc material in the region of the posterior longitudinal ligament was removed. Dissection was carried out using a high-speed drill and 3-0 Karlin curettes. Uncinate processes were drilled down and removed and osteophytes from the inferior margin of the body of C4 were removed with a Kerrison 2 mm gold punch. After the central canal and lateral recesses were well decompressed hemostasis was achieved with the bipolar  cautery and some small pledgets of Gelfoam soaked in thrombin that were later irrigated away.  A 7mm cortical ring allograft was then tamped into the interspace. C6-7 Was decompressed and fused in a similar fashion.  Next the retractor was removed and a 22mm Nuvasive plate was placed over the vertebral bodies and secured with 15 mm variable angle screws. A final localizing radiograph identified the position of the surgical construct. The stasis was achieved in the soft tissues and then the platysma was closed with 3-0 Vicryl in an interrupted fashion and 3-0 Vicryl was used in the subcuticular tissue. Blood loss was estimated at 75 ml.

## 2014-05-08 DIAGNOSIS — E291 Testicular hypofunction: Secondary | ICD-10-CM | POA: Diagnosis not present

## 2014-05-14 DIAGNOSIS — G8250 Quadriplegia, unspecified: Secondary | ICD-10-CM | POA: Diagnosis not present

## 2014-05-14 DIAGNOSIS — M4806 Spinal stenosis, lumbar region: Secondary | ICD-10-CM | POA: Diagnosis not present

## 2014-05-14 DIAGNOSIS — G894 Chronic pain syndrome: Secondary | ICD-10-CM | POA: Diagnosis not present

## 2014-05-14 DIAGNOSIS — M961 Postlaminectomy syndrome, not elsewhere classified: Secondary | ICD-10-CM | POA: Diagnosis not present

## 2014-05-15 DIAGNOSIS — M4714 Other spondylosis with myelopathy, thoracic region: Secondary | ICD-10-CM | POA: Diagnosis not present

## 2014-05-15 DIAGNOSIS — M4715 Other spondylosis with myelopathy, thoracolumbar region: Secondary | ICD-10-CM | POA: Diagnosis not present

## 2014-05-15 DIAGNOSIS — M4802 Spinal stenosis, cervical region: Secondary | ICD-10-CM | POA: Diagnosis not present

## 2014-05-15 DIAGNOSIS — Z6828 Body mass index (BMI) 28.0-28.9, adult: Secondary | ICD-10-CM | POA: Diagnosis not present

## 2014-05-19 ENCOUNTER — Other Ambulatory Visit: Payer: Self-pay | Admitting: Neurological Surgery

## 2014-05-22 DIAGNOSIS — E291 Testicular hypofunction: Secondary | ICD-10-CM | POA: Diagnosis not present

## 2014-05-23 ENCOUNTER — Encounter (HOSPITAL_COMMUNITY): Payer: Self-pay | Admitting: Pharmacy Technician

## 2014-05-26 NOTE — Pre-Procedure Instructions (Signed)
Dennis Cochran  05/26/2014   Your procedure is scheduled on:  06-02-2014    Monday   [facility] 5:30 AM.   Call this number if you have problems the morning of surgery: [phone]   Remember:   Do not eat food or drink liquids after midnight.    Take these medicines the morning of surgery with A SIP OF WATER: clonazepam(Klonopin),pain medication as needed,afrin nasal spray   Do not wear jewelry,  Do not wear lotions, powders, or perfumes. You may not wear deodorant.  Do not shave 48 hours prior to surgery. Men may shave face and neck.  Do not bring valuables to the hospital.  [facility]  for any belongings or valuables.               Contacts, dentures or bridgework may not be worn into surgery.   Leave suitcase in the car. After surgery it may be brought to your room.  For patients admitted to the hospital, discharge time is determined by your  treatment team.                  Special Instructions: See attached sheet for instructions on CHG shower/bath   Please read over the following fact sheets that you were given: Pain Booklet, Blood Transfusion Information and Surgical Site Infection Prevention

## 2014-05-27 ENCOUNTER — Encounter (HOSPITAL_COMMUNITY)
Admission: RE | Admit: 2014-05-27 | Discharge: 2014-05-27 | Disposition: A | Discharge status: Home / Self Care | Payer: Medicare Other | Source: Ambulatory Visit | Attending: Neurological Surgery | Admitting: Neurological Surgery

## 2014-05-27 ENCOUNTER — Encounter (HOSPITAL_COMMUNITY): Payer: Self-pay

## 2014-05-27 DIAGNOSIS — M4724 Other spondylosis with radiculopathy, thoracic region: Secondary | ICD-10-CM | POA: Insufficient documentation

## 2014-05-27 DIAGNOSIS — Z01818 Encounter for other preprocedural examination: Secondary | ICD-10-CM | POA: Insufficient documentation

## 2014-05-27 HISTORY — DX: Unspecified osteoarthritis, unspecified site: M19.90

## 2014-05-27 HISTORY — DX: Anxiety disorder, unspecified: F41.9

## 2014-05-27 HISTORY — DX: Hyperlipidemia, unspecified: E78.5

## 2014-05-27 HISTORY — DX: Panic disorder (episodic paroxysmal anxiety): F41.0

## 2014-05-27 HISTORY — DX: Cramp and spasm: R25.2

## 2014-05-27 HISTORY — DX: Essential (primary) hypertension: I10

## 2014-05-27 HISTORY — DX: Other seasonal allergic rhinitis: J30.2

## 2014-05-27 LAB — CBC
HCT: 47 % (ref 39.0–52.0)
HEMOGLOBIN: 16.1 g/dL (ref 13.0–17.0)
MCH: 30.8 pg (ref 26.0–34.0)
MCHC: 34.3 g/dL (ref 30.0–36.0)
MCV: 89.9 fL (ref 78.0–100.0)
Platelets: 236 10*3/uL (ref 150–400)
RBC: 5.23 MIL/uL (ref 4.22–5.81)
RDW: 13.2 % (ref 11.5–15.5)
WBC: 7.5 10*3/uL (ref 4.0–10.5)

## 2014-05-27 LAB — TYPE AND SCREEN
ABO/RH(D): A POS
Antibody Screen: NEGATIVE

## 2014-05-27 LAB — BASIC METABOLIC PANEL
Anion gap: 4 — ABNORMAL LOW (ref 5–15)
BUN: 16 mg/dL (ref 6–23)
CALCIUM: 9.3 mg/dL (ref 8.4–10.5)
CHLORIDE: 103 meq/L (ref 96–112)
CO2: 30 mmol/L (ref 19–32)
CREATININE: 1.12 mg/dL (ref 0.50–1.35)
GFR calc Af Amer: 80 mL/min — ABNORMAL LOW (ref >90)
GFR, EST NON AFRICAN AMERICAN: 69 mL/min — AB (ref >90)
Glucose, Bld: 89 mg/dL (ref 70–99)
Potassium: 4.7 mmol/L (ref 3.5–5.1)
Sodium: 137 mmol/L (ref 135–145)

## 2014-05-27 LAB — SURGICAL PCR SCREEN
MRSA, PCR: NEGATIVE
Staphylococcus aureus: NEGATIVE

## 2014-05-27 LAB — ABO/RH: ABO/RH(D): A POS

## 2014-06-01 MED ORDER — CEFAZOLIN SODIUM-DEXTROSE 2-3 GM-% IV SOLR
2.0000 g | INTRAVENOUS | Status: AC
  Administered 2014-06-02 (×2): 2 g via INTRAVENOUS
  Filled 2014-06-01: qty 50

## 2014-06-02 ENCOUNTER — Inpatient Hospital Stay (HOSPITAL_COMMUNITY)
Admission: RE | Admit: 2014-06-02 | Discharge: 2014-06-07 | DRG: 460 | Disposition: A | Discharge status: Home Health | Payer: Medicare Other | Source: Ambulatory Visit | Attending: Neurological Surgery | Admitting: Neurological Surgery

## 2014-06-02 ENCOUNTER — Inpatient Hospital Stay (HOSPITAL_COMMUNITY): Payer: Medicare Other

## 2014-06-02 ENCOUNTER — Encounter (HOSPITAL_COMMUNITY)
Admission: RE | Disposition: A | Discharge status: Home Health | Payer: Medicare Other | Source: Ambulatory Visit | Attending: Neurological Surgery

## 2014-06-02 ENCOUNTER — Inpatient Hospital Stay (HOSPITAL_COMMUNITY): Payer: Medicare Other | Admitting: Certified Registered"

## 2014-06-02 ENCOUNTER — Encounter (HOSPITAL_COMMUNITY): Payer: Self-pay | Admitting: *Deleted

## 2014-06-02 DIAGNOSIS — I10 Essential (primary) hypertension: Secondary | ICD-10-CM | POA: Diagnosis present

## 2014-06-02 DIAGNOSIS — F410 Panic disorder [episodic paroxysmal anxiety] without agoraphobia: Secondary | ICD-10-CM | POA: Diagnosis present

## 2014-06-02 DIAGNOSIS — M4716 Other spondylosis with myelopathy, lumbar region: Secondary | ICD-10-CM | POA: Diagnosis present

## 2014-06-02 DIAGNOSIS — M4726 Other spondylosis with radiculopathy, lumbar region: Secondary | ICD-10-CM | POA: Diagnosis not present

## 2014-06-02 DIAGNOSIS — M4714 Other spondylosis with myelopathy, thoracic region: Secondary | ICD-10-CM | POA: Diagnosis not present

## 2014-06-02 DIAGNOSIS — G9741 Accidental puncture or laceration of dura during a procedure: Secondary | ICD-10-CM | POA: Diagnosis not present

## 2014-06-02 DIAGNOSIS — M4804 Spinal stenosis, thoracic region: Secondary | ICD-10-CM | POA: Diagnosis present

## 2014-06-02 DIAGNOSIS — Z79891 Long term (current) use of opiate analgesic: Secondary | ICD-10-CM | POA: Diagnosis not present

## 2014-06-02 DIAGNOSIS — E785 Hyperlipidemia, unspecified: Secondary | ICD-10-CM | POA: Diagnosis present

## 2014-06-02 DIAGNOSIS — M549 Dorsalgia, unspecified: Secondary | ICD-10-CM | POA: Diagnosis not present

## 2014-06-02 DIAGNOSIS — M1990 Unspecified osteoarthritis, unspecified site: Secondary | ICD-10-CM | POA: Diagnosis present

## 2014-06-02 DIAGNOSIS — G8220 Paraplegia, unspecified: Secondary | ICD-10-CM | POA: Diagnosis not present

## 2014-06-02 DIAGNOSIS — M4724 Other spondylosis with radiculopathy, thoracic region: Secondary | ICD-10-CM | POA: Diagnosis not present

## 2014-06-02 DIAGNOSIS — Z9889 Other specified postprocedural states: Secondary | ICD-10-CM | POA: Diagnosis not present

## 2014-06-02 DIAGNOSIS — M47814 Spondylosis without myelopathy or radiculopathy, thoracic region: Secondary | ICD-10-CM | POA: Diagnosis not present

## 2014-06-02 DIAGNOSIS — Z981 Arthrodesis status: Secondary | ICD-10-CM | POA: Diagnosis not present

## 2014-06-02 DIAGNOSIS — M4806 Spinal stenosis, lumbar region: Principal | ICD-10-CM | POA: Diagnosis present

## 2014-06-02 DIAGNOSIS — M5416 Radiculopathy, lumbar region: Secondary | ICD-10-CM | POA: Diagnosis present

## 2014-06-02 DIAGNOSIS — G959 Disease of spinal cord, unspecified: Secondary | ICD-10-CM | POA: Diagnosis present

## 2014-06-02 DIAGNOSIS — G8918 Other acute postprocedural pain: Secondary | ICD-10-CM | POA: Diagnosis not present

## 2014-06-02 DIAGNOSIS — Y658 Other specified misadventures during surgical and medical care: Secondary | ICD-10-CM | POA: Diagnosis not present

## 2014-06-02 DIAGNOSIS — Z419 Encounter for procedure for purposes other than remedying health state, unspecified: Secondary | ICD-10-CM

## 2014-06-02 SURGERY — POSTERIOR LUMBAR FUSION 2 LEVEL
Anesthesia: General | Site: Spine Lumbar

## 2014-06-02 MED ORDER — SODIUM CHLORIDE 0.9 % IJ SOLN
INTRAMUSCULAR | Status: AC
  Filled 2014-06-02: qty 10

## 2014-06-02 MED ORDER — GLYCOPYRROLATE 0.2 MG/ML IJ SOLN
INTRAMUSCULAR | Status: DC | PRN
  Administered 2014-06-02: 0.6 mg via INTRAVENOUS
  Administered 2014-06-02: 0.2 mg via INTRAVENOUS

## 2014-06-02 MED ORDER — PHENYLEPHRINE HCL 10 MG/ML IJ SOLN
INTRAMUSCULAR | Status: DC | PRN
  Administered 2014-06-02 (×3): 80 ug via INTRAVENOUS
  Administered 2014-06-02: 40 ug via INTRAVENOUS

## 2014-06-02 MED ORDER — KETOROLAC TROMETHAMINE 15 MG/ML IJ SOLN
INTRAMUSCULAR | Status: AC
  Filled 2014-06-02: qty 1

## 2014-06-02 MED ORDER — MORPHINE SULFATE ER 15 MG PO TBCR
60.0000 mg | EXTENDED_RELEASE_TABLET | Freq: Three times a day (TID) | ORAL | Status: DC
  Administered 2014-06-02 – 2014-06-07 (×15): 60 mg via ORAL
  Filled 2014-06-02 (×3): qty 4
  Filled 2014-06-02: qty 2
  Filled 2014-06-02 (×5): qty 4
  Filled 2014-06-02 (×3): qty 2
  Filled 2014-06-02 (×4): qty 4

## 2014-06-02 MED ORDER — IRBESARTAN 300 MG PO TABS
300.0000 mg | ORAL_TABLET | Freq: Every day | ORAL | Status: DC
  Administered 2014-06-02 – 2014-06-07 (×5): 300 mg via ORAL
  Filled 2014-06-02 (×9): qty 1

## 2014-06-02 MED ORDER — ONDANSETRON HCL 4 MG/2ML IJ SOLN
INTRAMUSCULAR | Status: DC | PRN
  Administered 2014-06-02: 4 mg via INTRAVENOUS

## 2014-06-02 MED ORDER — ACETAMINOPHEN 650 MG RE SUPP: 650.0000 mg | RECTAL | Status: DC | PRN

## 2014-06-02 MED ORDER — ROCURONIUM BROMIDE 50 MG/5ML IV SOLN
INTRAVENOUS | Status: AC
Start: 2014-06-02 — End: 2014-06-02
  Filled 2014-06-02: qty 1

## 2014-06-02 MED ORDER — ARTIFICIAL TEARS OP OINT
TOPICAL_OINTMENT | OPHTHALMIC | Status: AC
  Filled 2014-06-02: qty 3.5

## 2014-06-02 MED ORDER — SODIUM CHLORIDE 0.9 % IJ SOLN
3.0000 mL | Freq: Two times a day (BID) | INTRAMUSCULAR | Status: DC
  Administered 2014-06-02 – 2014-06-07 (×8): 3 mL via INTRAVENOUS

## 2014-06-02 MED ORDER — ALBUMIN HUMAN 5 % IV SOLN
INTRAVENOUS | Status: DC | PRN
  Administered 2014-06-02: 13:00:00 via INTRAVENOUS

## 2014-06-02 MED ORDER — THROMBIN 20000 UNITS EX SOLR
CUTANEOUS | Status: DC | PRN
  Administered 2014-06-02: 20 mL via TOPICAL

## 2014-06-02 MED ORDER — FENTANYL CITRATE 0.05 MG/ML IJ SOLN
INTRAMUSCULAR | Status: AC
  Filled 2014-06-02: qty 5

## 2014-06-02 MED ORDER — MIDAZOLAM HCL 2 MG/2ML IJ SOLN
INTRAMUSCULAR | Status: AC
  Filled 2014-06-02: qty 2

## 2014-06-02 MED ORDER — PREGABALIN 75 MG PO CAPS
150.0000 mg | ORAL_CAPSULE | Freq: Every evening | ORAL | Status: DC | PRN
  Filled 2014-06-02 (×2): qty 2

## 2014-06-02 MED ORDER — LIDOCAINE HCL (CARDIAC) 20 MG/ML IV SOLN
INTRAVENOUS | Status: AC
  Filled 2014-06-02: qty 5

## 2014-06-02 MED ORDER — BACITRACIN 50000 UNITS IM SOLR
INTRAMUSCULAR | Status: DC | PRN
  Administered 2014-06-02: 500 mL

## 2014-06-02 MED ORDER — LACTATED RINGERS IV SOLN
INTRAVENOUS | Status: DC | PRN
  Administered 2014-06-02 (×3): via INTRAVENOUS

## 2014-06-02 MED ORDER — ARTIFICIAL TEARS OP OINT
TOPICAL_OINTMENT | OPHTHALMIC | Status: DC | PRN
  Administered 2014-06-02: 1 via OPHTHALMIC

## 2014-06-02 MED ORDER — EPHEDRINE SULFATE 50 MG/ML IJ SOLN
INTRAMUSCULAR | Status: DC | PRN
  Administered 2014-06-02: 10 mg via INTRAVENOUS

## 2014-06-02 MED ORDER — GLYCOPYRROLATE 0.2 MG/ML IJ SOLN
INTRAMUSCULAR | Status: AC
  Filled 2014-06-02: qty 1

## 2014-06-02 MED ORDER — HYDROMORPHONE HCL 1 MG/ML IJ SOLN
0.5000 mg | INTRAMUSCULAR | Status: DC | PRN
  Administered 2014-06-02 (×2): 1 mg via INTRAVENOUS

## 2014-06-02 MED ORDER — GELATIN ABSORBABLE MT POWD
OROMUCOSAL | Status: DC | PRN
  Administered 2014-06-02: 1 mL via TOPICAL

## 2014-06-02 MED ORDER — FENTANYL CITRATE 0.05 MG/ML IJ SOLN
INTRAMUSCULAR | Status: DC | PRN
  Administered 2014-06-02 (×6): 50 ug via INTRAVENOUS

## 2014-06-02 MED ORDER — OXYCODONE HCL 5 MG PO TABS: 5.0000 mg | ORAL_TABLET | Freq: Once | ORAL | Status: DC | PRN

## 2014-06-02 MED ORDER — DEXAMETHASONE SODIUM PHOSPHATE 10 MG/ML IJ SOLN
INTRAMUSCULAR | Status: AC
  Filled 2014-06-02: qty 1

## 2014-06-02 MED ORDER — BUPIVACAINE HCL (PF) 0.5 % IJ SOLN
INTRAMUSCULAR | Status: DC | PRN
  Administered 2014-06-02: 5 mL
  Administered 2014-06-02: 25 mL

## 2014-06-02 MED ORDER — ROCURONIUM BROMIDE 50 MG/5ML IV SOLN
INTRAVENOUS | Status: AC
  Filled 2014-06-02: qty 1

## 2014-06-02 MED ORDER — MORPHINE SULFATE 15 MG PO TABS
15.0000 mg | ORAL_TABLET | Freq: Every day | ORAL | Status: DC | PRN
  Administered 2014-06-05 – 2014-06-06 (×2): 15 mg via ORAL
  Filled 2014-06-02 (×2): qty 1

## 2014-06-02 MED ORDER — SODIUM CHLORIDE 0.9 % IJ SOLN: 3.0000 mL | INTRAMUSCULAR | Status: DC | PRN

## 2014-06-02 MED ORDER — ROCURONIUM BROMIDE 50 MG/5ML IV SOLN
INTRAVENOUS | Status: AC
  Filled 2014-06-02: qty 2

## 2014-06-02 MED ORDER — NEOSTIGMINE METHYLSULFATE 10 MG/10ML IV SOLN
INTRAVENOUS | Status: DC | PRN
  Administered 2014-06-02: 4 mg via INTRAVENOUS

## 2014-06-02 MED ORDER — 0.9 % SODIUM CHLORIDE (POUR BTL) OPTIME
TOPICAL | Status: DC | PRN
  Administered 2014-06-02: 1000 mL

## 2014-06-02 MED ORDER — OXYCODONE HCL 5 MG/5ML PO SOLN: 5.0000 mg | Freq: Once | ORAL | Status: DC | PRN

## 2014-06-02 MED ORDER — CEFAZOLIN SODIUM-DEXTROSE 2-3 GM-% IV SOLR
INTRAVENOUS | Status: AC
  Filled 2014-06-02: qty 50

## 2014-06-02 MED ORDER — METHOCARBAMOL 500 MG PO TABS
500.0000 mg | ORAL_TABLET | Freq: Four times a day (QID) | ORAL | Status: DC | PRN
  Administered 2014-06-02 – 2014-06-07 (×11): 500 mg via ORAL
  Filled 2014-06-02 (×13): qty 1

## 2014-06-02 MED ORDER — DEXTROSE 5 % IV SOLN
10.0000 mg | INTRAVENOUS | Status: DC | PRN
  Administered 2014-06-02: 25 ug/min via INTRAVENOUS

## 2014-06-02 MED ORDER — CLONAZEPAM 1 MG PO TABS
2.0000 mg | ORAL_TABLET | Freq: Every evening | ORAL | Status: DC | PRN
  Administered 2014-06-02: 1 mg via ORAL
  Administered 2014-06-03 – 2014-06-06 (×4): 2 mg via ORAL
  Filled 2014-06-02 (×5): qty 2

## 2014-06-02 MED ORDER — HYDROMORPHONE HCL 1 MG/ML IJ SOLN
INTRAMUSCULAR | Status: AC
  Filled 2014-06-02: qty 1

## 2014-06-02 MED ORDER — PROPOFOL 10 MG/ML IV BOLUS
INTRAVENOUS | Status: DC | PRN
  Administered 2014-06-02: 170 mg via INTRAVENOUS

## 2014-06-02 MED ORDER — MIDAZOLAM HCL 2 MG/2ML IJ SOLN
2.0000 mg | Freq: Once | INTRAMUSCULAR | Status: AC
  Administered 2014-06-02: 2 mg via INTRAVENOUS

## 2014-06-02 MED ORDER — PROPOFOL 10 MG/ML IV BOLUS
INTRAVENOUS | Status: AC
  Filled 2014-06-02: qty 20

## 2014-06-02 MED ORDER — SODIUM CHLORIDE 0.9 % IV SOLN
INTRAVENOUS | Status: DC
  Administered 2014-06-02 – 2014-06-04 (×3): via INTRAVENOUS
  Administered 2014-06-05: 1000 mL via INTRAVENOUS

## 2014-06-02 MED ORDER — KETOROLAC TROMETHAMINE 15 MG/ML IJ SOLN
15.0000 mg | Freq: Four times a day (QID) | INTRAMUSCULAR | Status: AC
  Administered 2014-06-02 – 2014-06-03 (×5): 15 mg via INTRAVENOUS
  Filled 2014-06-02 (×9): qty 1

## 2014-06-02 MED ORDER — CLONAZEPAM 1 MG PO TABS: 1.0000 mg | ORAL_TABLET | Freq: Three times a day (TID) | ORAL | Status: DC | PRN

## 2014-06-02 MED ORDER — ONDANSETRON HCL 4 MG/2ML IJ SOLN: 4.0000 mg | Freq: Four times a day (QID) | INTRAMUSCULAR | Status: DC | PRN

## 2014-06-02 MED ORDER — HYDROMORPHONE HCL 1 MG/ML IJ SOLN
INTRAMUSCULAR | Status: AC
  Filled 2014-06-02: qty 2

## 2014-06-02 MED ORDER — CLONAZEPAM 1 MG PO TABS
1.0000 mg | ORAL_TABLET | Freq: Two times a day (BID) | ORAL | Status: DC | PRN
  Administered 2014-06-02 – 2014-06-07 (×5): 1 mg via ORAL
  Filled 2014-06-02 (×5): qty 1

## 2014-06-02 MED ORDER — METHOCARBAMOL 1000 MG/10ML IJ SOLN
500.0000 mg | Freq: Four times a day (QID) | INTRAVENOUS | Status: DC | PRN
  Filled 2014-06-02 (×2): qty 5

## 2014-06-02 MED ORDER — ONDANSETRON HCL 4 MG/2ML IJ SOLN: 4.0000 mg | INTRAMUSCULAR | Status: DC | PRN

## 2014-06-02 MED ORDER — MORPHINE SULFATE 2 MG/ML IJ SOLN
1.0000 mg | INTRAMUSCULAR | Status: DC | PRN
  Administered 2014-06-02 – 2014-06-03 (×6): 2 mg via INTRAVENOUS
  Administered 2014-06-03: 4 mg via INTRAVENOUS
  Administered 2014-06-03 – 2014-06-04 (×3): 2 mg via INTRAVENOUS
  Administered 2014-06-04 – 2014-06-06 (×11): 4 mg via INTRAVENOUS
  Administered 2014-06-06 – 2014-06-07 (×5): 2 mg via INTRAVENOUS
  Filled 2014-06-02: qty 1
  Filled 2014-06-02: qty 2
  Filled 2014-06-02: qty 1
  Filled 2014-06-02: qty 2
  Filled 2014-06-02 (×5): qty 1
  Filled 2014-06-02 (×2): qty 2
  Filled 2014-06-02: qty 1
  Filled 2014-06-02: qty 2
  Filled 2014-06-02: qty 1
  Filled 2014-06-02 (×5): qty 2
  Filled 2014-06-02: qty 1
  Filled 2014-06-02: qty 2
  Filled 2014-06-02: qty 1
  Filled 2014-06-02: qty 2
  Filled 2014-06-02 (×3): qty 1

## 2014-06-02 MED ORDER — LIDOCAINE-EPINEPHRINE 1 %-1:100000 IJ SOLN
INTRAMUSCULAR | Status: DC | PRN
  Administered 2014-06-02: 5 mL

## 2014-06-02 MED ORDER — MENTHOL 3 MG MT LOZG: 1.0000 | LOZENGE | OROMUCOSAL | Status: DC | PRN

## 2014-06-02 MED ORDER — GEMFIBROZIL 600 MG PO TABS
600.0000 mg | ORAL_TABLET | Freq: Every day | ORAL | Status: DC
  Administered 2014-06-02 – 2014-06-07 (×6): 600 mg via ORAL
  Filled 2014-06-02 (×9): qty 1

## 2014-06-02 MED ORDER — HYDROMORPHONE HCL 1 MG/ML IJ SOLN
0.2500 mg | INTRAMUSCULAR | Status: DC | PRN
  Administered 2014-06-02 (×4): 0.5 mg via INTRAVENOUS

## 2014-06-02 MED ORDER — ACETAMINOPHEN 325 MG PO TABS
650.0000 mg | ORAL_TABLET | ORAL | Status: DC | PRN
  Administered 2014-06-04: 650 mg via ORAL
  Filled 2014-06-02: qty 2

## 2014-06-02 MED ORDER — MIDAZOLAM HCL 5 MG/5ML IJ SOLN
INTRAMUSCULAR | Status: DC | PRN
  Administered 2014-06-02: 2 mg via INTRAVENOUS

## 2014-06-02 MED ORDER — CEFAZOLIN SODIUM 1-5 GM-% IV SOLN
1.0000 g | Freq: Three times a day (TID) | INTRAVENOUS | Status: AC
  Administered 2014-06-02 – 2014-06-03 (×2): 1 g via INTRAVENOUS
  Filled 2014-06-02 (×3): qty 50

## 2014-06-02 MED ORDER — SODIUM CHLORIDE 0.9 % IV SOLN
250.0000 mL | INTRAVENOUS | Status: DC
  Administered 2014-06-02: 250 mL via INTRAVENOUS

## 2014-06-02 MED ORDER — EPHEDRINE SULFATE 50 MG/ML IJ SOLN
INTRAMUSCULAR | Status: AC
  Filled 2014-06-02: qty 1

## 2014-06-02 MED ORDER — PHENOL 1.4 % MT LIQD: 1.0000 | OROMUCOSAL | Status: DC | PRN

## 2014-06-02 MED ORDER — LIDOCAINE HCL (CARDIAC) 20 MG/ML IV SOLN
INTRAVENOUS | Status: DC | PRN
  Administered 2014-06-02: 70 mg via INTRAVENOUS

## 2014-06-02 MED ORDER — ALUM & MAG HYDROXIDE-SIMETH 200-200-20 MG/5ML PO SUSP
30.0000 mL | Freq: Four times a day (QID) | ORAL | Status: DC | PRN
  Administered 2014-06-05 – 2014-06-06 (×3): 30 mL via ORAL
  Filled 2014-06-02 (×3): qty 30

## 2014-06-02 MED ORDER — SUCCINYLCHOLINE CHLORIDE 20 MG/ML IJ SOLN
INTRAMUSCULAR | Status: AC
  Filled 2014-06-02: qty 1

## 2014-06-02 MED ORDER — ROCURONIUM BROMIDE 100 MG/10ML IV SOLN
INTRAVENOUS | Status: DC | PRN
  Administered 2014-06-02: 20 mg via INTRAVENOUS
  Administered 2014-06-02: 50 mg via INTRAVENOUS
  Administered 2014-06-02 (×5): 20 mg via INTRAVENOUS

## 2014-06-02 SURGICAL SUPPLY — 71 items
BAG DECANTER FOR FLEXI CONT (MISCELLANEOUS) ×3 IMPLANT
BLADE CLIPPER SURG (BLADE) ×2 IMPLANT
BUR MATCHSTICK NEURO 3.0 LAGG (BURR) ×3 IMPLANT
CAGE COROENT LRG 8X9X28M SPINE (Cage) ×8 IMPLANT
CANISTER SUCT 3000ML (MISCELLANEOUS) ×3 IMPLANT
CONT SPEC 4OZ CLIKSEAL STRL BL (MISCELLANEOUS) ×12 IMPLANT
COVER BACK TABLE 60X90IN (DRAPES) ×3 IMPLANT
DECANTER SPIKE VIAL GLASS SM (MISCELLANEOUS) ×3 IMPLANT
DRAPE C-ARM 42X72 X-RAY (DRAPES) ×6 IMPLANT
DRAPE LAPAROTOMY 100X72X124 (DRAPES) ×3 IMPLANT
DRAPE POUCH INSTRU U-SHP 10X18 (DRAPES) ×3 IMPLANT
DRAPE PROXIMA HALF (DRAPES) IMPLANT
DRSG OPSITE POSTOP 4X10 (GAUZE/BANDAGES/DRESSINGS) ×2 IMPLANT
DRSG OPSITE POSTOP 4X6 (GAUZE/BANDAGES/DRESSINGS) ×2 IMPLANT
DURAPREP 26ML APPLICATOR (WOUND CARE) ×3 IMPLANT
ELECT REM PT RETURN 9FT ADLT (ELECTROSURGICAL) ×3
ELECTRODE REM PT RTRN 9FT ADLT (ELECTROSURGICAL) ×1 IMPLANT
GAUZE SPONGE 4X4 12PLY STRL (GAUZE/BANDAGES/DRESSINGS) ×1 IMPLANT
GAUZE SPONGE 4X4 16PLY XRAY LF (GAUZE/BANDAGES/DRESSINGS) ×4 IMPLANT
GLOVE BIO SURGEON STRL SZ7 (GLOVE) ×2 IMPLANT
GLOVE BIOGEL PI IND STRL 7.5 (GLOVE) IMPLANT
GLOVE BIOGEL PI IND STRL 8 (GLOVE) IMPLANT
GLOVE BIOGEL PI IND STRL 8.5 (GLOVE) ×2 IMPLANT
GLOVE BIOGEL PI INDICATOR 7.5 (GLOVE) ×2
GLOVE BIOGEL PI INDICATOR 8 (GLOVE) ×6
GLOVE BIOGEL PI INDICATOR 8.5 (GLOVE) ×4
GLOVE ECLIPSE 6.5 STRL STRAW (GLOVE) ×2 IMPLANT
GLOVE ECLIPSE 7.5 STRL STRAW (GLOVE) ×10 IMPLANT
GLOVE ECLIPSE 8.5 STRL (GLOVE) ×8 IMPLANT
GLOVE EXAM NITRILE LRG STRL (GLOVE) IMPLANT
GLOVE EXAM NITRILE MD LF STRL (GLOVE) IMPLANT
GLOVE EXAM NITRILE XL STR (GLOVE) IMPLANT
GLOVE EXAM NITRILE XS STR PU (GLOVE) IMPLANT
GOWN STRL REUS W/ TWL LRG LVL3 (GOWN DISPOSABLE) IMPLANT
GOWN STRL REUS W/ TWL XL LVL3 (GOWN DISPOSABLE) IMPLANT
GOWN STRL REUS W/TWL 2XL LVL3 (GOWN DISPOSABLE) ×10 IMPLANT
GOWN STRL REUS W/TWL LRG LVL3 (GOWN DISPOSABLE) ×3
GOWN STRL REUS W/TWL XL LVL3 (GOWN DISPOSABLE) ×3
HEMOSTAT POWDER KIT SURGIFOAM (HEMOSTASIS) IMPLANT
HEMOSTAT POWDER SURGIFOAM 1G (HEMOSTASIS) ×2 IMPLANT
KIT BASIN OR (CUSTOM PROCEDURE TRAY) ×3 IMPLANT
KIT ROOM TURNOVER OR (KITS) ×3 IMPLANT
LIQUID BAND (GAUZE/BANDAGES/DRESSINGS) ×5 IMPLANT
MARKER SKIN DUAL TIP RULER LAB (MISCELLANEOUS) ×2 IMPLANT
NDL SPNL 18GX3.5 QUINCKE PK (NEEDLE) IMPLANT
NEEDLE HYPO 22GX1.5 SAFETY (NEEDLE) ×3 IMPLANT
NEEDLE SPNL 18GX3.5 QUINCKE PK (NEEDLE) ×6 IMPLANT
NS IRRIG 1000ML POUR BTL (IV SOLUTION) ×3 IMPLANT
PACK LAMINECTOMY NEURO (CUSTOM PROCEDURE TRAY) ×3 IMPLANT
PAD ARMBOARD 7.5X6 YLW CONV (MISCELLANEOUS) ×13 IMPLANT
PATTIES SURGICAL .5 X.5 (GAUZE/BANDAGES/DRESSINGS) ×2 IMPLANT
PATTIES SURGICAL .5 X1 (DISPOSABLE) ×3 IMPLANT
ROD RELINE TI LORD 5.5X70 (Rod) ×4 IMPLANT
SCREW LOCK RELINE 5.5 TULIP (Screw) ×12 IMPLANT
SCREW RELINE-O POLY 6.5X45 (Screw) ×12 IMPLANT
SPONGE LAP 4X18 X RAY DECT (DISPOSABLE) IMPLANT
SPONGE SURGIFOAM ABS GEL 100 (HEMOSTASIS) ×3 IMPLANT
SUT PROLENE 6 0 BV (SUTURE) ×2 IMPLANT
SUT VIC AB 1 CT1 18XBRD ANBCTR (SUTURE) ×1 IMPLANT
SUT VIC AB 1 CT1 8-18 (SUTURE) ×9
SUT VIC AB 2-0 CP2 18 (SUTURE) ×5 IMPLANT
SUT VIC AB 3-0 SH 8-18 (SUTURE) ×7 IMPLANT
SYR 20ML ECCENTRIC (SYRINGE) ×3 IMPLANT
SYR 3ML LL SCALE MARK (SYRINGE) ×12 IMPLANT
SYR 5ML LL (SYRINGE) IMPLANT
TOWEL OR 17X24 6PK STRL BLUE (TOWEL DISPOSABLE) ×3 IMPLANT
TOWEL OR 17X26 10 PK STRL BLUE (TOWEL DISPOSABLE) ×3 IMPLANT
TRAP SPECIMEN MUCOUS 40CC (MISCELLANEOUS) ×3 IMPLANT
TRAY FOLEY CATH 14FRSI W/METER (CATHETERS) ×1 IMPLANT
TRAY FOLEY CATH 16FRSI W/METER (SET/KITS/TRAYS/PACK) ×2 IMPLANT
WATER STERILE IRR 1000ML POUR (IV SOLUTION) ×3 IMPLANT

## 2014-06-02 NOTE — Anesthesia Preprocedure Evaluation (Addendum)
Anesthesia Evaluation  Patient identified by MRN, date of birth, ID band Patient awake    Reviewed: Allergy & Precautions, NPO status , Patient's Chart, lab work & pertinent test results  Airway Mallampati: II  TM Distance: >3 FB Neck ROM: full    Dental  (+) Teeth Intact, Dental Advisory Given   Pulmonary          Cardiovascular hypertension,     Neuro/Psych  Headaches, Anxiety    GI/Hepatic   Endo/Other    Renal/GU      Musculoskeletal  (+) Arthritis -,   Abdominal   Peds  Hematology   Anesthesia Other Findings   Reproductive/Obstetrics                            Anesthesia Physical Anesthesia Plan  ASA: II  Anesthesia Plan: General   Post-op Pain Management:    Induction: Intravenous  Airway Management Planned: Oral ETT  Additional Equipment:   Intra-op Plan:   Post-operative Plan: Extubation in OR  Informed Consent: I have reviewed the patients History and Physical, chart, labs and discussed the procedure including the risks, benefits and alternatives for the proposed anesthesia with the patient or authorized representative who has indicated his/her understanding and acceptance.     Plan Discussed with: CRNA, Anesthesiologist and Surgeon  Anesthesia Plan Comments:         Anesthesia Quick Evaluation

## 2014-06-02 NOTE — Anesthesia Procedure Notes (Signed)
Procedure Name: Intubation Date/Time: 06/02/2014 7:39 AM Performed by: Lanell MatarBAKER, Charnetta Wulff M Pre-anesthesia Checklist: Patient identified, Timeout performed, Emergency Drugs available, Suction available and Patient being monitored Patient Re-evaluated:Patient Re-evaluated prior to inductionOxygen Delivery Method: Circle system utilized Preoxygenation: Pre-oxygenation with 100% oxygen Intubation Type: IV induction Ventilation: Mask ventilation without difficulty Laryngoscope Size: Miller and 2 Grade View: Grade II Tube type: Oral Tube size: 7.5 mm Number of attempts: 1 Airway Equipment and Method: Stylet Placement Confirmation: ETT inserted through vocal cords under direct vision,  breath sounds checked- equal and bilateral and positive ETCO2 Secured at: 22 cm Tube secured with: Tape Dental Injury: Teeth and Oropharynx as per pre-operative assessment

## 2014-06-02 NOTE — Transfer of Care (Signed)
Immediate Anesthesia Transfer of Care Note  Patient: Dennis Cochran  Procedure(s) Performed: Procedure(s): THORACIC ELEVEN-TWELVE,LUMBAR TWO-THREE,LUMBAR THREE-FOUR LAMINECTOMY WITH  A  LUMBAR TWO-THREE,LUMBAR THREE-FOUR POSTERIOR LUMBAR INTERBODY FUSION. (N/A)  Patient Location: PACU  Anesthesia Type:General  Level of Consciousness: awake, alert  and oriented  Airway & Oxygen Therapy: Patient Spontanous Breathing and Patient connected to nasal cannula oxygen  Post-op Assessment: Report given to PACU RN, Post -op Vital signs reviewed and stable and Patient moving all extremities X 4  Post vital signs: Reviewed and stable  Complications: No apparent anesthesia complications

## 2014-06-02 NOTE — Progress Notes (Signed)
Orthopedic Tech Progress Note Patient Details:  Rudene ChristiansKent M Marszalek 02/07/1951 [id number] [employer] called for brace Patient ID: Rudene ChristiansKent M Boley, male   DOB: 02/07/1951, 64 y.o.   MRN: [id number]   Orie Routsia R Thompson 06/02/2014, 5:05 PM

## 2014-06-02 NOTE — Progress Notes (Signed)
Utilization review completed.  
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
## 2014-06-02 NOTE — OR Nursing (Signed)
Patient is leaving on socks and shoes during surgery. The shoes are clean and have only been worn in the OR. He did this on previous procedures. Patient wears shoes to bed at home. Patient states it helps with pain in his feet.She covers were placed on shoes prior to taking patient to OR. Surgeon is aware and is okay with it.DDay RN

## 2014-06-02 NOTE — Progress Notes (Signed)
eLink Physician-Brief Progress Note Patient Name: Dennis Cochran DOB: [birth year] MRN: [id number]   Date of Service  06/02/2014  HPI/Events of Note  64 yo man, s/p thoracic and lumbar surgeries, hemodynamically stable with exception of some mild htn. No evidence instability.   eICU Interventions  Will follow peripherally, no intervention at this time.      Intervention Category Evaluation Type: New Patient Evaluation  [name] S. 06/02/2014, 4:31 PM

## 2014-06-02 NOTE — Op Note (Signed)
Date of surgery: 06/02/2014 Preoperative diagnosis: Lumbar spondylosis and stenosis L2-3 and L3-4 with radiculopathy, thoracic stenosis T11-T12 with myelopathy Postoperative diagnosis: Lumbar spondylosis and stenosis L2-3 L3-4 with radiculopathy, status post arthrodesis L4 to sacrum, thoracic stenosis T11-T12 with myelopathy Procedure: Thoracic laminectomy T11-T12 decompression of stenosis. Laminectomy of L2 and L3, decompression of L2-L3 and L4 nerve roots bilaterally and laterally with more work and required for simple posterior interbody arthrodesis, posterior lumbar interbody arthrodesis using peek spacers local autograft L2-3 L3-4. Posterior lateral arthrodesis with local autograft L2-L4. Removal of previously placed hardware L4-L5 and S1. Pedicle screw fixation L2-L4 segmentally with invasive hardware. Surgeon: Barnett AbuHenry Vernadine Coombs First assistant: Radonna RickerAL Cabell M.D. Anesthesia: Gen. endotracheal Indications: Patient is a 64 year old individual is had a previous lumbar decompression from L4 to the sacrum is also had an anterior cervical decompression secondary to profound myelopathy with quadruped as is. His found to have stenosis at L2-3 and L3-4. He's been advised regarding surgical decompression and stabilization of this he also has thoracic stenosis at T11-T12. Surgeries to be undertaken there to decompress his thoracic spinal canal.  Procedure: The patient was brought to the operating room supine on a stretcher. After the smooth induction of general endotracheal anesthesia, he was turned prone. The back was prepped with alcohol and DuraPrep and draped in a sterile fashion. An elliptical incision was made around this previous to lower scars in the back and these were excised. The lumbar dorsal fascia was opened on the side of midline to expose the spinous processes of L4 L3 and L2. After localizing the hardware it was gradually uncovered and removed. At this point attention was turned to T11-T12 so that the  laminar arch to be decompressed at T11-T12 to allow for collection of autograft. A vertical incision was created over this region after localizing the T11-T12 space with the needle and then a subperiosteal dissection was performed and the laminar arch and the interlaminar space at T11-T12. He completes minus process and laminar arch of T11 was removed. Dissection was carried out laterally to expose the redundant yellow ligament out to the region of the facets. Once this area was decompressed the disc space was palpated and noted be intact. The dural decompression dorsally was adequate and no further decompression of the disc space was attempted. The stasis in the soft tissues was obtained meticulously. The wound was packed for later closure with #1 Vicryl in the lump in the thoracodorsal fascia 2-0 Vicryl in the subcutaneous tissues and 3-0 Vicryl subcuticularly. Attention was then turned back to the original wound where the laminectomy was completed at L2 and L3 removing the entirety of the facet at L2-3 junction and also at L3-L4. The common dural tube was widely decompressed on the left side there is a small dural tear that was sewn primarily with a singular 6-0 Prolene suture. The dural repair was noted be watertight. At L2-L3 and L4. Once this was accomplished the disc space was fairly isolated and after removing moving and packing off the epidural veins the disc space was entered at L3-4 with a #15 blade and accommodation of curettes and rongeurs was used to remove a substantial quantity of severely degenerated and desiccated disc material endplates were shaved clean and once this was accomplished interbody spacers were placed to allow for some distraction and also realignment with a more normal lordosis being preserved. The interbody space was sized with an 8 x 28 mm spacer that had 8 of lordosis tiled into it. The spacer was  filled with autograft and then tamped into the lateral aspect of the disc space.  Was done bilaterally total of 9 mL of bone graft was then packed into the interspace. Attention was turned to L to L3. Here a similar decompression was undertaken doing a total discectomy bilaterally removing all the cartilage from the endplates using a toothed curette to facilitate this once the disc space was completely emptied is felt that it would be sized for the same size spacer that is 8 mm tall 8 lordotic 28 mm long spacer this was filled with autograft and similarly tamped into place and again total of 9 mL of bone graft was left in the interspace.  Attention was then turned to the lateral grafting surface and the transverse process of L2-L3 and L4 were decorticated. Pedicle entry sites were then chosen at L2 and these were probed and tapped ultimately for 6.5 x 45 mm screws which were placed in L2-L3 and also L4. Once the screws were placed they were connected to precontoured rods which measured 70 mm in length. A bit of compression was placed on the construct. Once the system was locked them to his final position final radiographs were obtained in AP and lateral projection. These were felt to be quite adequate. Remainder of the bone graft was packed into the lateral gutters which had previously been decorticated from L2-L3 and L4 in the intertransverse spaces. Once the bone graft was placed and hemostasis was checked meticulously and the wound and then the lumbar dorsal fascia was closed with #1 Vicryl interrupted fashion 2-0 Vicryl was used in the subcutaneous anus tissue and 3-0 Vicryl subcuticularly. Dermabond was placed on the skin after it was noted that a honeycomb dressing was placed over this. Blood loss for the procedure was estimated at 800 mL allowing 250 mL of Cell Saver blood returned to the patient.

## 2014-06-03 DIAGNOSIS — M5416 Radiculopathy, lumbar region: Secondary | ICD-10-CM

## 2014-06-03 DIAGNOSIS — G959 Disease of spinal cord, unspecified: Secondary | ICD-10-CM

## 2014-06-03 LAB — CBC
HCT: 36.9 % — ABNORMAL LOW (ref 39.0–52.0)
HEMOGLOBIN: 12.6 g/dL — AB (ref 13.0–17.0)
MCH: 30.2 pg (ref 26.0–34.0)
MCHC: 34.1 g/dL (ref 30.0–36.0)
MCV: 88.5 fL (ref 78.0–100.0)
PLATELETS: 186 10*3/uL (ref 150–400)
RBC: 4.17 MIL/uL — AB (ref 4.22–5.81)
RDW: 13.1 % (ref 11.5–15.5)
WBC: 13.5 10*3/uL — AB (ref 4.0–10.5)

## 2014-06-03 LAB — BASIC METABOLIC PANEL
Anion gap: 11 (ref 5–15)
BUN: 22 mg/dL (ref 6–23)
CALCIUM: 8.3 mg/dL — AB (ref 8.4–10.5)
CHLORIDE: 101 mmol/L (ref 96–112)
CO2: 24 mmol/L (ref 19–32)
CREATININE: 1.37 mg/dL — AB (ref 0.50–1.35)
GFR calc Af Amer: 62 mL/min — ABNORMAL LOW (ref >90)
GFR, EST NON AFRICAN AMERICAN: 54 mL/min — AB (ref >90)
Glucose, Bld: 131 mg/dL — ABNORMAL HIGH (ref 70–99)
POTASSIUM: 4.3 mmol/L (ref 3.5–5.1)
Sodium: 136 mmol/L (ref 135–145)

## 2014-06-03 MED FILL — Heparin Sodium (Porcine) Inj 1000 Unit/ML: INTRAMUSCULAR | Qty: 30 | Status: AC

## 2014-06-03 MED FILL — Sodium Chloride IV Soln 0.9%: INTRAVENOUS | Qty: 2000 | Status: AC

## 2014-06-03 NOTE — Evaluation (Signed)
Physical Therapy Evaluation Patient Details Name: Dennis Cochran MRN: [id number] DOB: 07-23-1950 Today's Date: 06/03/2014   History of Present Illness  Patient is a [age] to male with complex hx of multi level decompression and fusions for myelopathy. Patient with residual marked spasticity in lower extremities and decreased strength. Patient is now s/p thoracic laminectomy/decompression, PLIF on 04/03/15.  Clinical Impression  Patient demonstrates deficits in functional mobility as indicated below. Will need continued skilled PT to address deficits and maximize function. Will see as indicated and progress as tolerated.  Patient educated extensively regarding positional changes with staff, mobility expectations, safety, and precautions.  VSS assessed throughout session, BP stable 132/66, SpO2 93% on room air post activity at rest.     Follow Up Recommendations CIR    Equipment Recommendations  Other (comment) (TBD)    Recommendations for Other Services Rehab consult     Precautions / Restrictions Precautions Precautions: Fall;Back (knees buckle) Precaution Booklet Issued: Yes (comment) Precaution Comments: reviewed back precautions related to mobility and ADL. Required Braces or Orthoses: Spinal Brace Spinal Brace: Lumbar corset;Applied in sitting position Restrictions Weight Bearing Restrictions: No      Mobility  Bed Mobility Overal bed mobility: Needs Assistance Bed Mobility: Rolling;Sidelying to Sit Rolling: Min assist Sidelying to sit: Min assist       General bed mobility comments: Min assist for LE positioing and multi modal cues to elevate to EOB, patient with increased time and increased effort to reach upright  Transfers Overall transfer level: Needs assistance Equipment used: 2 person hand held assist Transfers: Sit to/from [employer]Stand;Stand Pivot Transfers Sit to Stand: Max assist;+2 physical assistance Stand pivot transfers: Max assist;+2 physical assistance        General transfer comment: Patient with significant LE weakness and buckling during transfer, Maximial effort to come to standing with increased assist, Max cues for relaxation and breathing. Patient able to initiate shuffling steps but unable to maintain quad set to take weight, required bilateral LE knee blocking to prevent complete fall.   Ambulation/Gait             General Gait Details: unsafe to perform at this time  Stairs            Wheelchair Mobility    Modified Rankin (Stroke Patients Only)       Balance Overall balance assessment: Needs assistance Sitting-balance support: Bilateral upper extremity supported;Feet supported Sitting balance-Leahy Scale: Poor Sitting balance - Comments: patient unable to off load UE support long enough to don back brace, required assist to don brace while sitting EOB   Standing balance support: Bilateral upper extremity supported;During functional activity Standing balance-Leahy Scale: Zero Standing balance comment: patient unable to withstand static standing at this time, rquired +2 maximal assist                             Pertinent Vitals/Pain Pain Assessment: Faces Faces Pain Scale: Hurts whole lot Pain Location: back with sit to stand Pain Descriptors / Indicators: Operative site guarding;Aching;Guarding;Grimacing Pain Intervention(s): Monitored during session;Premedicated before session;Repositioned;Limited activity within patient's tolerance Reports 5/10 pain to nurse post activity    Home Living Family/patient expects to be discharged to:: Private residence Living Arrangements: Alone Available Help at Discharge: Friend(s);Available PRN/intermittently Type of Home: House Home Access: Level entry     Home Layout: One level Home Equipment: Bedside commode;Walker - 4 wheels;Wheelchair - manual;Wheelchair - power (loftstrand x 1)  Prior Function Level of Independence: Independent with assistive  device(s)         Comments: w/c in community and loftstrand crutch in home for last 6 months     Hand Dominance   Dominant Hand: Right    Extremity/Trunk Assessment   Upper Extremity Assessment: RUE deficits/detail;LUE deficits/detail RUE Deficits / Details: hypersensistivity to cold, wind     LUE Deficits / Details: hypersensitivity to cold, wind   Lower Extremity Assessment: Generalized weakness;RLE deficits/detail;LLE deficits/detail (foot drop noted) RLE Deficits / Details: 2+/5 weakness upon testing LLE Deficits / Details: LLE 2-/5 weakness noted, unable to fully assess strength, patient does rely on UE to assist with moving and repositioning legs, (this will not be possible without breaking rpecautions moving forward)  Cervical / Trunk Assessment:  (history of cervical fusion, now s/p PLIF and lami)  Communication   Communication: No difficulties  Cognition Arousal/Alertness: Awake/alert Behavior During Therapy: WFL for tasks assessed/performed Overall Cognitive Status: Within Functional Limits for tasks assessed                      General Comments General comments (skin integrity, edema, etc.): patient hyper sensitive to sensory stimulation    Exercises        Assessment/Plan    PT Assessment Patient needs continued PT services  PT Diagnosis Difficulty walking;Abnormality of gait;Acute pain   PT Problem List Decreased strength;Decreased range of motion;Decreased activity tolerance;Decreased balance;Decreased mobility;Decreased coordination;Impaired sensation;Impaired tone;Pain  PT Treatment Interventions DME instruction;Gait training;Stair training;Functional mobility training;Therapeutic activities;Therapeutic exercise;Balance training;Patient/family education   PT Goals (Current goals can be found in the Care Plan section) Acute Rehab PT Goals Patient Stated Goal: to get home PT Goal Formulation: With patient Time For Goal Achievement:  06/17/14 Potential to Achieve Goals: Good    Frequency Min 5X/week   Barriers to discharge Decreased caregiver support      Co-evaluation PT/OT/SLP Co-Evaluation/Treatment: Yes Reason for Co-Treatment: For patient/therapist safety PT goals addressed during session: Mobility/safety with mobility;Balance OT goals addressed during session: ADL's and self-care       End of Session Equipment Utilized During Treatment: Gait belt;Back brace Activity Tolerance: Patient limited by pain Patient left: in chair;with call bell/phone within reach Nurse Communication: Mobility status (need cotton sheets)         Time: 1610-9604 PT Time Calculation (min) (ACUTE ONLY): 48 min   Charges:   PT Evaluation $Initial PT Evaluation Tier I: 1 Procedure PT Treatments $Therapeutic Activity: 8-22 mins   PT G CodesFabio Asa 06-17-14, 11:40 AM Charlotte Crumb, PT DPT  [phone]
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
## 2014-06-03 NOTE — Progress Notes (Signed)
Rehab Admissions Coordinator Note:  Patient was screened by Clois DupesBoyette, Kamdin Follett Godwin for appropriateness for an Inpatient Acute Rehab Consult per PT recommendation and discussion.  At this time, we are recommending Inpatient Rehab consult.  Clois DupesBoyette, Leslee Suire Godwin 06/03/2014, 12:12 PM  I can be reached at [phone].

## 2014-06-03 NOTE — Progress Notes (Signed)
Patient ID: Dennis ChristiansKent M Bloomquist, male   DOB: 12-20-1950, 64 y.o.   MRN: [id number] Stable post op   To nicu for post op care secondary to number of special needs secondary to paraparesis and dysesthesias of lower extremity in addition to hemodynamic stability post 6+hr surgery.

## 2014-06-03 NOTE — Progress Notes (Signed)
Ketorolac tromethamine 30mg  wasted in sink. Londell MohKaty Walton, RN witnessed.

## 2014-06-03 NOTE — Evaluation (Signed)
Occupational Therapy Evaluation Patient Details Name: Dennis Cochran MRN: [id number] DOB: 05-Mar-1951 Today's Date: 06/03/2014    History of Present Illness Pt presents with severe spondylitic stenosis at C4-5 and again at C6-C7 above previous decompression fusion at C5-C6.  He underwent decompression and stabilization sx 04-11-14.    Clinical Impression   Pt was performing ADL and ADL transfers at a modified independent level prior to admission.  He has a long hx of LB spasms and myelopathy and required w/c use for longer distances. Pt presents with marked LE weakness and buckling with standing and transfers requiring +2 assist for transfers and any standing ADL.  Pt is highly motivated to return to independence and will be an excellent rehab candidate. Began instruction in back precautions.  Will follow acutely.  Follow Up Recommendations  CIR    Equipment Recommendations       Recommendations for Other Services       Precautions / Restrictions Precautions Precautions: Fall;Back (knees buckle) Precaution Booklet Issued: Yes (comment) Precaution Comments: reviewed back precautions related to mobility and ADL. Required Braces or Orthoses: Spinal Brace Spinal Brace: Lumbar corset;Applied in sitting position Restrictions Weight Bearing Restrictions: No      Mobility Bed Mobility Overal bed mobility: Needs Assistance Bed Mobility: Rolling;Sidelying to Sit Rolling: Min assist Sidelying to sit: Min assist       General bed mobility comments: Min assist for LE positioing and multi modal cues to elevate to EOB, patient with increased time and increased effort to reach upright  Transfers Overall transfer level: Needs assistance Equipment used: 2 person hand held assist Transfers: Sit to/from [employer] Sit to Stand: Max assist;+2 physical assistance Stand pivot transfers: Max assist;+2 physical assistance       General transfer comment: Patient with significant  LE weakness and buckling during transfer, Maximial effort to come to standing with increased assist, Max cues for relaxation and breathing. Patient able to initiate shuffling steps but unable to maintain quad set to take weight, required bilateral LE knee blocking to prevent complete fall.     Balance Overall balance assessment: Needs assistance Sitting-balance support: Bilateral upper extremity supported;Feet supported Sitting balance-Leahy Scale: Poor Sitting balance - Comments: patient unable to off load UE support long enough to don back brace, required assist to don brace while sitting EOB   Standing balance support: Bilateral upper extremity supported;During functional activity Standing balance-Leahy Scale: Zero Standing balance comment: patient unable to withstand static standing at this time, rquired +2 maximal assist                            ADL Overall ADL's : Needs assistance/impaired Eating/Feeding: Independent   Grooming: Wash/dry face;Set up;Sitting   Upper Body Bathing: Minimal assitance;Sitting   Lower Body Bathing: Total assistance;Sit to/from stand;+2 for physical assistance   Upper Body Dressing : Minimal assistance;Maximal assistance;Sitting (min clothing, max for back brace)   Lower Body Dressing: +2 for physical assistance;Total assistance;Sit to/from stand Lower Body Dressing Details (indicate cue type and reason): pt typically pulls his leg up onto his opposite knee to donn his socks and shoes. Toilet Transfer: +2 for physical assistance;Stand-pivot;Maximal assistance Toilet Transfer Details (indicate cue type and reason): simulated to chair Toileting- Clothing Manipulation and Hygiene: +2 for physical assistance;Total assistance;Sit to/from stand         General ADL Comments: Pt is aware of AE for LB ADL, but does not own.  Vision                     Perception     Praxis      Pertinent Vitals/Pain Pain Assessment:  Faces Faces Pain Scale: Hurts whole lot Pain Location: back with sit to stand Pain Descriptors / Indicators: Operative site guarding;Aching;Guarding;Grimacing Pain Intervention(s): Monitored during session;Premedicated before session;Repositioned;Limited activity within patient's tolerance     Hand Dominance Right   Extremity/Trunk Assessment Upper Extremity Assessment Upper Extremity Assessment: RUE deficits/detail;LUE deficits/detail RUE Deficits / Details: hypersensistivity to cold, wind LUE Deficits / Details: hypersensitivity to cold, wind   Lower Extremity Assessment Lower Extremity Assessment: Generalized weakness;RLE deficits/detail;LLE deficits/detail (foot drop noted) RLE Deficits / Details: 2+/5 weakness upon testing RLE: Unable to fully assess due to pain RLE Sensation:  (hyper sensitivity to cold/pain) RLE Coordination: decreased fine motor;decreased gross motor LLE Deficits / Details: LLE 2-/5 weakness noted, unable to fully assess strength, patient does rely on UE to assist with moving and repositioning legs, (this will not be possible without breaking rpecautions moving forward) LLE: Unable to fully assess due to pain LLE Sensation:  (hyper sensitivity to cold/pain) LLE Coordination: decreased fine motor;decreased gross motor   Cervical / Trunk Assessment Cervical / Trunk Assessment:  (history of cervical fusion, now s/p PLIF and lami)   Communication Communication Communication: No difficulties   Cognition Arousal/Alertness: Awake/alert Behavior During Therapy: WFL for tasks assessed/performed Overall Cognitive Status: Within Functional Limits for tasks assessed                     General Comments       Exercises       Shoulder Instructions      Home Living Family/patient expects to be discharged to:: Private residence Living Arrangements: Alone Available Help at Discharge: Friend(s);Available PRN/intermittently Type of Home: House Home  Access: Level entry     Home Layout: One level     Bathroom Shower/Tub: Producer, television/film/video: Handicapped height     Home Equipment: Bedside commode;Walker - 4 wheels;Wheelchair - manual;Wheelchair - power (loftstrand x 1)          Prior Functioning/Environment Level of Independence: Independent with assistive device(s)        Comments: w/c in community and loftstrand crutch in home for last 6 months    OT Diagnosis: Generalized weakness;Acute pain   OT Problem List: Decreased strength;Decreased activity tolerance;Impaired balance (sitting and/or standing);Decreased knowledge of use of DME or AE;Decreased knowledge of precautions;Pain;Impaired sensation   OT Treatment/Interventions: Self-care/ADL training;DME and/or AE instruction;Therapeutic activities;Patient/family education;Balance training    OT Goals(Current goals can be found in the care plan section) Acute Rehab OT Goals Patient Stated Goal: to get home OT Goal Formulation: With patient Time For Goal Achievement: 06/17/14 Potential to Achieve Goals: Good ADL Goals Pt Will Perform Grooming: with min assist;standing Pt Will Perform Lower Body Bathing: with min assist;with adaptive equipment;sit to/from stand Pt Will Perform Lower Body Dressing: with min assist;with adaptive equipment;sit to/from stand Pt Will Transfer to Toilet: with min assist;ambulating;bedside commode (over toilet) Pt Will Perform Toileting - Clothing Manipulation and hygiene: with min assist;with adaptive equipment;sit to/from stand Additional ADL Goal #1: Pt will adhere to back precautions during ADL independently. Additional ADL Goal #2: Pt will donn and doff back brace independently.  OT Frequency: Min 2X/week   Barriers to D/C:            Co-evaluation PT/OT/SLP Co-Evaluation/Treatment: Yes Reason for  Co-Treatment: For patient/therapist safety PT goals addressed during session: Mobility/safety with mobility;Balance OT  goals addressed during session: ADL's and self-care      End of Session Equipment Utilized During Treatment: Gait belt Nurse Communication: Mobility status  Activity Tolerance: Patient limited by pain Patient left: in chair;with call bell/phone within reach   Time: 0916-1000 OT Time Calculation (min): 44 min Charges:  OT General Charges $OT Visit: 1 Procedure OT Evaluation $Initial OT Evaluation Tier I: 1 Procedure G-Codes:    Evern BioMayberry, Maeryn Mcgath Lynn 06/03/2014, 12:24 PM [phone]

## 2014-06-03 NOTE — Progress Notes (Signed)
I met with pt at bedside. We discussed a possible inpt rehab admission pending bed availability when he is medically ready. I will follow up tomorrow to assist in planning dispo. [phone]

## 2014-06-03 NOTE — Progress Notes (Signed)
Patient ID: Dennis ChristiansKent M Wygant, male   DOB: [birth year], 64 y.o.   MRN: [id number] Vital signs are stable Motor function remains unchanged Week in left lower extremity more than right lower extremity Appreciate consultation from rehabilitation medicine I do feel that he is an excellent candidate for comprehensive inpatient rehabilitation We'll transfer to the floor Note is made the patient does not tolerate polyester sheets or polyester gowns Postoperative hematocrit stable at 32

## 2014-06-03 NOTE — Clinical Social Work Note (Signed)
Clinical Social Worker received referral for possible ST-SNF placement.  Chart reviewed.  PT/OT recommending inpatient rehab at this time.  Spoke with inpatient rehab admissions coordinator who will follow up with patient to discuss possible inpatient rehab placement.    CSW signing off - please re consult if social work needs arise.  Macario GoldsJesse Stephaun Million, Kentucky[place]CS[place] [phone]

## 2014-06-03 NOTE — Progress Notes (Signed)
Pt's Foley removed 06/03/14 at 09:45; no void since Foley removal.  Bladder scan = 507ml; pt not distended and has no c/o discomfort or needing to void.  Per Dr.  Danielle DessElsner, will continue to monitor and may replace Foley if pt has bladder distension, discomfort or urgency w/ inability to void.

## 2014-06-04 MED ORDER — DEXAMETHASONE 4 MG PO TABS
4.0000 mg | ORAL_TABLET | Freq: Three times a day (TID) | ORAL | Status: DC
  Administered 2014-06-04 – 2014-06-07 (×10): 4 mg via ORAL
  Filled 2014-06-04 (×10): qty 1

## 2014-06-04 MED ORDER — DIAZEPAM 5 MG/ML IJ SOLN
5.0000 mg | Freq: Once | INTRAMUSCULAR | Status: AC
  Administered 2014-06-04: 5 mg via INTRAVENOUS
  Filled 2014-06-04: qty 2

## 2014-06-04 MED ORDER — KETOROLAC TROMETHAMINE 15 MG/ML IJ SOLN
15.0000 mg | Freq: Four times a day (QID) | INTRAMUSCULAR | Status: AC
  Administered 2014-06-04 – 2014-06-05 (×5): 15 mg via INTRAVENOUS
  Filled 2014-06-04 (×5): qty 1

## 2014-06-04 MED ORDER — DIAZEPAM 5 MG PO TABS
5.0000 mg | ORAL_TABLET | Freq: Four times a day (QID) | ORAL | Status: DC | PRN
  Administered 2014-06-04 – 2014-06-07 (×6): 5 mg via ORAL
  Filled 2014-06-04 (×7): qty 1

## 2014-06-04 NOTE — Progress Notes (Signed)
Patient ID: Dennis Cochran, male   DOB: January 14, 1951, 64 y.o.   MRN: [id number] Vss. Pain poorly controlled C/o spasms Dressing clean and dry Motor function is stable Will add decadron not ready for rehab today

## 2014-06-04 NOTE — Progress Notes (Signed)
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  He underwent decompression and stabilization sx 04-11-14.     PT Comments    Pt. Has the following deficits: LE strength, high pain with spasms and around incision site, poor static sitting and standing balance. Pt. Would be a good candidate for ongoing physical therapy at [facility]. Pt. Pain was 8/1o today. Pt. Unable to stand and do bed mobility without +2 max assist.  Follow Up Recommendations  [facility]     Equipment Recommendations  Other (comment)    Recommendations for Other Services Rehab consult     Precautions / Restrictions Precautions Precautions: Fall;Back Precaution Booklet Issued: Yes (comment) Precaution Comments: reviewed back precautions related to mobility and ADL. Required Braces or Orthoses: Spinal Brace Spinal Brace: Lumbar corset;Applied in sitting position Restrictions Weight Bearing Restrictions: No    Mobility  Bed Mobility Overal bed mobility: Needs Assistance;+2 for physical assistance Bed Mobility: Rolling;Sidelying to Sit Rolling: Mod assist Sidelying to sit: Mod assist       General bed mobility comments: mod assist for LE and UE trunk to upright; increased effort to reach upright  Transfers Overall transfer level: Needs assistance Equipment used: 2 person hand held assist Transfers: Sit to/from Stand Sit to Stand: Max assist;+2 physical assistance         General transfer comment: Patient with significant LE weakness and buckling during transfer, Maximial effort to come to standing with increased assist, Max cues for relaxation and breathing. Patient able to initiate shuffling steps but unable to maintain quad set to take weight, required bilateral LE knee blocking to prevent  complete fall.   Ambulation/Gait                 Stairs            Wheelchair Mobility    Modified Rankin (Stroke Patients Only)       Balance Overall balance assessment: Needs assistance Sitting-balance support: Bilateral upper extremity supported Sitting balance-Leahy Scale: Poor Sitting balance - Comments: patient unable to off load UE support long enough to don back brace, required assist to don brace while sitting EOB   Standing balance support: Bilateral upper extremity supported;During functional activity Standing balance-Leahy Scale: Zero Standing balance comment: patient able to stand with +2 max assist but unable to stay standing more than 5 seconds.                    Cognition Arousal/Alertness: Awake/alert Behavior During Therapy: Anxious;Agitated Overall Cognitive Status: Within Functional Limits for tasks assessed                      Exercises      General Comments        Pertinent Vitals/Pain Pain Score: 8  Pain Location: back with sitting Pain Descriptors / Indicators: Grimacing;Guarding;Operative site guarding Pain Intervention(s): RN gave pain meds during session;Monitored during session    Home Living                      Prior Function            PT Goals (current goals can now be found in the care plan section) Progress towards PT goals: Progressing toward goals    Frequency  Min 5X/week  PT Plan Current plan remains appropriate    Co-evaluation             End of Session Equipment Utilized During Treatment: Back brace Activity Tolerance: Patient limited by pain Patient left: in bed;with call bell/phone within reach;with bed alarm set     Time: 1320-1355 PT Time Calculation (min) (ACUTE ONLY): 35 min  Charges:                       G Codes:      Perlie Mayo, SPTA 06/04/2014, 2:39 PM

## 2014-06-04 NOTE — Progress Notes (Signed)
Pt has received 4 mg morphine, ms contin, robaxin, and klonopin, and still states pain is 10/10 and pt asked nurse if she could bring in medicine early before the time it is due.  Nurse explained that it is hospital policy that we can not bring in medicine early.  Heat pack and cold pack offered to patient and patient refused.  Patient was repositioned.  Will continue to monitor.    Estanislado EmmsAshley Schwarz, RN 06/04/2014 1:17 AM.

## 2014-06-04 NOTE — Progress Notes (Signed)
I met with pt at bedside. Also discussed with Dr. Naaman Plummer and Dr. Ellene Route. Pt with poor pain control today. Not able to tolerate intense therapies at this time. Pt is hopeful once his pain control is improved that he can then progress with therapies. He is open to inpt rehab admission, but would like to return home as soon as able also. I will follow up tomorrow. [phone]

## 2014-06-05 NOTE — Anesthesia Postprocedure Evaluation (Signed)
Anesthesia Post Note  Patient: Dennis Cochran  Procedure(s) Performed: Procedure(s) (LRB): THORACIC ELEVEN-TWELVE,LUMBAR TWO-THREE,LUMBAR THREE-FOUR LAMINECTOMY WITH  A  LUMBAR TWO-THREE,LUMBAR THREE-FOUR POSTERIOR LUMBAR INTERBODY FUSION. (N/A)  Anesthesia type: General  Patient location: PACU  Post pain: Pain level controlled and Adequate analgesia  Post assessment: Post-op Vital signs reviewed, Patient's Cardiovascular Status Stable, Respiratory Function Stable, Patent Airway and Pain level controlled  Last Vitals:  Filed Vitals:   06/05/14 0205  BP: 112/82  Pulse: 56  Temp: 36.4 C  Resp: 16    Post vital signs: Reviewed and stable  Level of consciousness: awake, alert  and oriented  Complications: No apparent anesthesia complications

## 2014-06-05 NOTE — Progress Notes (Signed)
Patient ID: Dennis Cochran, male   DOB: [birth year], 64 y.o.   MRN: [id number] Pain under much better control Patient is only stood once or physical therapy to evaluate today I believe he is a good candidate for comprehensive inpatient rehabilitation particularly given his myelopathic deficits Would encourage transfer to rehabilitation when bed is available

## 2014-06-05 NOTE — Progress Notes (Signed)
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  He underwent decompression and stabilization sx 04-11-14.     PT Comments    Patient making small progress today and able to take some small steps forward. He is highly motivated to progress just required to go his speed and techniques. Continue to recommend comprehensive inpatient rehab (CIR) for post-acute therapy needs.   Follow Up Recommendations  CIR     Equipment Recommendations  Other (comment) (TBD)    Recommendations for Other Services       Precautions / Restrictions Precautions Precautions: Fall;Back Precaution Comments: pt able to state 2/3 back precautions (not arching) Required Braces or Orthoses: Spinal Brace Spinal Brace: Lumbar corset;Applied in sitting position Restrictions Weight Bearing Restrictions: No    Mobility  Bed Mobility Overal bed mobility: Needs Assistance Bed Mobility: Rolling;Sidelying to Sit Rolling: Min guard Sidelying to sit: Min assist       General bed mobility comments: verbal cues for log roll technique, head flat, without use of rail  Transfers Overall transfer level: Needs assistance Equipment used: Rolling walker (2 wheeled) Transfers: Sit to/from Stand Sit to Stand: Max assist;+2 physical assistance Stand pivot transfers: Max assist;+2 physical assistance       General transfer comment: Increased time with LE buckling initially, heavy reliance on UEs, assist to stabilize as he reached for RW, stood for a minute and gained upright posture before ambulating  Ambulation/Gait Ambulation/Gait assistance: +2 physical assistance;Mod assist;+2 safety/equipment Ambulation Distance (Feet): 10 Feet Assistive device: Rolling walker (2 wheeled) Gait  Pattern/deviations: Step-to pattern;Decreased step length - right;Decreased step length - left;Decreased stance time - right;Decreased stance time - left;Decreased dorsiflexion - left;Decreased dorsiflexion - right     General Gait Details: Mod A +2 to prevent buckling and for use of RW. Cues for upright posture and positioning of RW   Stairs            Wheelchair Mobility    Modified Rankin (Stroke Patients Only)       Balance     Sitting balance-Leahy Scale: Fair Sitting balance - Comments: pt able to use UEs to donn back brace in unsupported sitting     Standing balance-Leahy Scale: Zero                      Cognition Arousal/Alertness: Awake/alert Behavior During Therapy: Anxious Overall Cognitive Status: Within Functional Limits for tasks assessed                      Exercises      General Comments        Pertinent Vitals/Pain Pain Assessment: Faces Faces Pain Scale: Hurts a little bit Pain Location: back Pain Descriptors / Indicators: Aching Pain Intervention(s): Premedicated before session    Home Living                      Prior Function            PT Goals (current goals can now be found in the care plan section) Acute Rehab PT Goals Patient Stated Goal: to get home Progress towards PT goals: Progressing toward goals    Frequency  Min 5X/week    PT Plan Current plan remains appropriate    Co-evaluation PT/OT/SLP  Co-Evaluation/Treatment: Yes Reason for Co-Treatment: For patient/therapist safety PT goals addressed during session: Mobility/safety with mobility;Balance;Proper use of DME OT goals addressed during session: ADL's and self-care     End of Session Equipment Utilized During Treatment: Back brace;Gait belt Activity Tolerance: Patient tolerated treatment well Patient left: in chair;with call bell/phone within reach     Time: 0865-7846 PT Time Calculation (min) (ACUTE ONLY): 25 min  Charges:   $Gait Training: 8-22 mins                    G Codes:      Fredrich Birks 06/05/2014, 12:27 PM  06/05/2014 Fredrich Birks PTA [phone] pager [phone] office

## 2014-06-05 NOTE — Progress Notes (Signed)
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  He underwent decompression and stabilization sx 04-11-14.    OT comments  Pt requires extra time and attention to his hypersensitivities to all stimuli (auditory, light, touch).  He tolerated activities well with improvement in bed mobility, ability to sit without UE support, ability to donn his back brace, and was able to ambulate a short distance for the first time.  Pt continue to heavily rely on UE support.  He requires +2 max assist for OOB. Pt continues to be an ideal inpatient rehab candidate.  Follow Up Recommendations  [facility]    Equipment Recommendations       Recommendations for Other Services      Precautions / Restrictions Precautions Precautions: Fall;Back Precaution Comments: pt able to state 2/3 back precautions (not arching) Required Braces or Orthoses: Spinal Brace Spinal Brace: Lumbar corset;Applied in sitting position Restrictions Weight Bearing Restrictions: No       Mobility Bed Mobility Overal bed mobility: Needs Assistance Bed Mobility: Rolling;Sidelying to Sit Rolling: Min guard Sidelying to sit: Min assist       General bed mobility comments: verbal cues for log roll technique, head flat, without use of rail  Transfers Overall transfer level: Needs assistance Equipment used: Rolling walker (2 wheeled) Transfers: Sit to/from Stand Sit to Stand: Max assist;+2 physical assistance         General transfer comment: Increased time with LE buckling initially, heavy reliance on UEs, assist to stabilize as he reached for RW, stood for a minute and gained upright posture before ambulating    Balance     Sitting balance-Leahy Scale: Fair Sitting balance - Comments: pt able to use UEs  to donn back brace in unsupported sitting     Standing balance-Leahy Scale: Zero                     ADL Overall ADL's : Needs assistance/impaired     Grooming: Oral care;Brushing hair;Sitting;Set up                               Functional mobility during ADLs: +2 for physical assistance;Moderate assistance;Rolling walker General ADL Comments: Pt able to ambulate with +2 assist and heavy reliance on RW.      Vision                     Perception     Praxis      Cognition   Behavior During Therapy: Anxious Overall Cognitive Status: Within Functional Limits for tasks assessed                       Extremity/Trunk Assessment               Exercises     Shoulder Instructions       General Comments      Pertinent Vitals/ Pain       Pain Assessment: Faces Faces Pain Scale: Hurts a little bit Pain Location: back Pain Descriptors / Indicators: Aching Pain Intervention(s): Premedicated before session  Home Living  Prior Functioning/Environment              Frequency Min 2X/week     Progress Toward Goals  OT Goals(current goals can now be found in the care plan section)  Progress towards OT goals: Progressing toward goals  Acute Rehab OT Goals Patient Stated Goal: to get home  Plan Discharge plan remains appropriate    Co-evaluation    PT/OT/SLP Co-Evaluation/Treatment: Yes Reason for Co-Treatment: For patient/therapist safety   OT goals addressed during session: ADL's and self-care      End of Session Equipment Utilized During Treatment: Gait belt   Activity Tolerance Patient tolerated treatment well   Patient Left in chair;with call bell/phone within reach   Nurse Communication          Time: 1610-9604 OT Time Calculation (min): 48 min  Charges: OT General Charges $OT Visit: 1 Procedure OT Treatments $Self Care/Home Management :  23-37 mins  Evern Bio 06/05/2014, 11:59 AM  [phone]

## 2014-06-06 NOTE — Progress Notes (Signed)
°   06/06/14 1139  Clinical Encounter Type  Visited With Patient  Visit Type Initial  Referral From Nurse  Spiritual Encounters  Spiritual Needs Emotional  Stress Factors  Patient Stress Factors Lack of knowledge  Family Stress Factors None identified  Advance Directives (For Healthcare)  Does patient have an advance directive? No   Nurse asked that I visit with the patient. Patient was alert and sitting in a chair upon entering the room.  I introduced myself. Patient immediately asked me, "Do you know where I am going next?" Patient would like to know if he is going to rehab or home.  I explained that I didn't know but I would check for him.  Patient further explained that he is here due to surgery on his back, neck, and that it was the second time in 20 years.  He is single, never married, and has a mother in hospice care right now.  Patient was crying when speaking of his mother.  He has several family members with brain disease.  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  He seemed better after we talked, as I was leaving the nurse was coming in to help him get back in bed. Nurse said that doctor would be in to speak with him soon about going to rehab. When I exited he was pleased his concerns had been addressed.   Lorna FewChristina Yarbrough, Chaplain Intern 06/06/2014, 11:49 AM

## 2014-06-06 NOTE — Progress Notes (Signed)
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  He underwent decompression and stabilization sx 04-11-14.     PT Comments    Patient making gains with activity tolerance.  However continued to require +2 assist for mobility and gait.  Agree with need for CIR at d/c.  Follow Up Recommendations  CIR     Equipment Recommendations  Other (comment) (TBD)    Recommendations for Other Services       Precautions / Restrictions Precautions Precautions: Fall;Back Precaution Comments: Able to state 2/3 back precautions.  Reviewed with patient. Required Braces or Orthoses: Spinal Brace Spinal Brace: Lumbar corset;Applied in sitting position Restrictions Weight Bearing Restrictions: No    Mobility  Bed Mobility Overal bed mobility: Needs Assistance Bed Mobility: Rolling;Sidelying to Sit;Sit to Sidelying Rolling: Min guard Sidelying to sit: Mod assist     Sit to sidelying: Min assist General bed mobility comments: Verbal cues for technique.  Assist to raise trunk to sitting position.   Required increased time.  Assist to bring LE's onto bed to return to sidelying.  Transfers Overall transfer level: Needs assistance Equipment used: Rolling walker (2 wheeled) Transfers: Sit to/from Stand Sit to Stand: Max assist;+2 physical assistance         General transfer comment: Verbal cues for hand placement and technique.  Patient able to scoot to edge of bed/chair with min guard assist.  Required +2 assist to power up to standing, with knees buckling.  Patient uses UE's to complete rise to standing.  Required more assist from recliner than bed.  Ambulation/Gait Ambulation/Gait assistance: Mod assist;+2 physical assistance;+2 safety/equipment Ambulation Distance (Feet):  120 Feet Assistive device: Rolling walker (2 wheeled) Gait Pattern/deviations: Step-through pattern;Decreased stride length;Narrow base of support Gait velocity: Decreased Gait velocity interpretation: Below normal speed for age/gender General Gait Details: Verbal cues for safe use of RW.  Patient with decreased LE control with stance - knees buckling throughout gait.  Patient depending on UE's on RW for bearing weight.   Stairs            Wheelchair Mobility    Modified Rankin (Stroke Patients Only)       Balance           Standing balance support: Bilateral upper extremity supported Standing balance-Leahy Scale: Poor                      Cognition Arousal/Alertness: Awake/alert Behavior During Therapy: Anxious Overall Cognitive Status: Within Functional Limits for tasks assessed                      Exercises      General Comments        Pertinent Vitals/Pain Pain Assessment: Faces Faces Pain Scale: Hurts a little bit Pain Location: LE's and feet Pain Descriptors / Indicators: Aching Pain Intervention(s): Monitored during session;Repositioned    Home Living                      Prior Function            PT Goals (current goals can now be found in the care plan section) Progress towards PT goals: Progressing toward goals    Frequency  Min 5X/week    PT Plan Current plan remains  appropriate    Co-evaluation             End of Session Equipment Utilized During Treatment: Gait belt;Back brace Activity Tolerance: Patient tolerated treatment well Patient left: in bed;with call bell/phone within reach;with bed alarm set     Time: 1339-1410 PT Time Calculation (min) (ACUTE ONLY): 31 min  Charges:  $Gait Training: 23-37 mins                    G Codes:      Vena Austria 2014-07-04, 2:24 PM Durenda Hurt. Renaldo Fiddler, [facility] Acute Rehab Services Pager [phone]

## 2014-06-06 NOTE — Progress Notes (Signed)
I met with pt and he states he is better than prior to surgery. He states he still needs assist with up out of bed for therapy is not assisting him as he does for himself at home. He has particular ways to mobilize. He is in agreement to d/c home with home health and states he can arrange for assistance at home. I have contacted RN CM. We will sign off. [phone]

## 2014-06-06 NOTE — Progress Notes (Signed)
Patient ID: Dennis Cochran, male   DOB: 31-Oct-1950, 64 y.o.   MRN: [id number] Vital signs are stable Pain under better control Incisions are clean and dry Patient to go to rehabilitation No but appears to be available May have to wait until Monday Stable

## 2014-06-06 NOTE — Progress Notes (Signed)
Per physical therapy notes pt continued to require +2 assist for mobility and gait. Soc Worker called for possible SNF placement vs HHC; CM will continue to follow for DCP; B Shelba Flakehandler RN,BSN,MHA [phone]

## 2014-06-07 NOTE — Discharge Summary (Signed)
Physician Discharge Summary  Patient ID: Rudene ChristiansKent M Missildine MRN: [id number] DOB/AGE: February 03, 1951 64 y.o.  Admit date: 06/02/2014 Discharge date: 06/07/2014  Admission Diagnoses:Lumbar spondylosis and stenosis L2-3 and L3-4 with radiculopathy, thoracic stenosis T11-T12 with myelopathy   Discharge Diagnoses: Lumbar spondylosis and stenosis L2-3 and L3-4 with radiculopathy, thoracic stenosis T11-T12 with myelopathy  Active Problems:   Myelopathy   Discharged Condition: good  Hospital Course: Mr. Katrinka BlazingSmith was admitted and taken to the operating room for a lumbar decompression and fusion and a thoracic laminectomy for decompression. Post op he has been at his baseline which involves profound  Weakness in the lower extremities. His wound is clean, dry, and without signs of infection. He is tolerating a regular diet. He has eschewed both pt, and ot, and states his arrangements at home are better suited for him. He does not wish to go to rehab, and would like to be discharged.   Treatments: surgery: Thoracic laminectomy T11-T12 decompression of stenosis. Laminectomy of L2 and L3, decompression of L2-L3 and L4 nerve roots bilaterally and laterally with more work and required for simple posterior interbody arthrodesis, posterior lumbar interbody arthrodesis using peek spacers local autograft L2-3 L3-4. Posterior lateral arthrodesis with local autograft L2-L4. Removal of previously placed hardware L4-L5 and S1. Pedicle screw fixation L2-L4 segmentally with invasive hardware.   Discharge Exam: Blood pressure 130/56, pulse 73, temperature 98.1 F (36.7 C), temperature source Oral, resp. rate 20, height 5' 8.5" (1.74 m), weight 86.637 kg (191 lb), SpO2 97 %. General appearance: alert, cooperative and appears stated age, weakness both lower extremities unchanged from preop status.   Disposition: 01-Home or Self Care Thoracic spondylosis with cord compression    Medication List    TAKE these medications       desonide 0.05 % cream  Commonly known as:  DESOWEN  Apply 1 application topically 2 (two) times daily.     EDARBI 80 MG Tabs  Generic drug:  Azilsartan Medoxomil  Take 80 mg by mouth daily.     gemfibrozil 600 MG tablet  Commonly known as:  LOPID  Take 600 mg by mouth daily.     KLONOPIN 1 MG tablet  Generic drug:  clonazePAM  Take 1-2 mg by mouth 3 (three) times daily as needed for anxiety. Take 1 mg by mouth twice daily and take 2 mg by mouth at bedtime as needed for anxiety     morphine 60 MG 12 hr tablet  Commonly known as:  MS CONTIN  Take 60 mg by mouth 3 (three) times daily.     morphine 15 MG tablet  Commonly known as:  MSIR  Take 15 mg by mouth daily as needed for severe pain.     oxymetazoline 0.05 % nasal spray  Commonly known as:  AFRIN  Place 1 spray into both nostrils 2 (two) times daily.     pregabalin 150 MG capsule  Commonly known as:  LYRICA  Take 150 mg by mouth at bedtime as needed (Orders from [place]).     testosterone enanthate 200 MG/ML injection  Commonly known as:  DELATESTRYL  Inject 200 mg into the muscle every 14 (fourteen) days.           Follow-up Information    Follow up with Stefani DamaELSNER,HENRY J, MD In 3 weeks.   Specialty:  Neurosurgery   Why:  call office to make an appointment   Contact information:   [address] [place] [address] [phone]  Signed: Daylyn Azbill L 06/07/2014, 10:05 AM

## 2014-06-07 NOTE — Progress Notes (Signed)
Discharge instructions reviewed with patient and sister. All questions answered at this time. Transport home by sister.   Sim BoastHavy, RN

## 2014-06-07 NOTE — Progress Notes (Signed)
RN informed patient that he is being discharge per his request and he wanting to know if home health was set up with proper equipment. RN explained that no HH has been set up and if needed, it can be provide to him. RN asked several times if patient needs HH and patient verbalized that he does not needed it. He stated "They have been trying to sell that to me the minute I got here and I don't need it". He continued on that he has several family member helping him and equipment at home.   Sim BoastHavy, RN

## 2014-06-07 NOTE — Progress Notes (Signed)
Offered to ambulate to chair this AM but patient refused. He continue to speak about his chronic pain and other issues. Patient is able to move in bed without assist. Will offered at a later time.   Sim BoastHavy, RN

## 2014-07-09 DIAGNOSIS — E291 Testicular hypofunction: Secondary | ICD-10-CM | POA: Diagnosis not present

## 2014-07-29 DIAGNOSIS — E291 Testicular hypofunction: Secondary | ICD-10-CM | POA: Diagnosis not present

## 2014-08-13 DIAGNOSIS — E291 Testicular hypofunction: Secondary | ICD-10-CM | POA: Diagnosis not present

## 2014-08-18 DIAGNOSIS — G890 Central pain syndrome: Secondary | ICD-10-CM | POA: Diagnosis not present

## 2014-08-18 DIAGNOSIS — R252 Cramp and spasm: Secondary | ICD-10-CM | POA: Diagnosis not present

## 2014-08-18 DIAGNOSIS — M961 Postlaminectomy syndrome, not elsewhere classified: Secondary | ICD-10-CM | POA: Diagnosis not present

## 2014-08-18 DIAGNOSIS — G8250 Quadriplegia, unspecified: Secondary | ICD-10-CM | POA: Diagnosis not present

## 2014-08-28 DIAGNOSIS — N529 Male erectile dysfunction, unspecified: Secondary | ICD-10-CM | POA: Diagnosis not present

## 2014-09-03 DIAGNOSIS — Z6828 Body mass index (BMI) 28.0-28.9, adult: Secondary | ICD-10-CM | POA: Diagnosis not present

## 2014-09-03 DIAGNOSIS — M4715 Other spondylosis with myelopathy, thoracolumbar region: Secondary | ICD-10-CM | POA: Diagnosis not present

## 2014-09-11 DIAGNOSIS — E291 Testicular hypofunction: Secondary | ICD-10-CM | POA: Diagnosis not present

## 2014-09-25 DIAGNOSIS — E291 Testicular hypofunction: Secondary | ICD-10-CM | POA: Diagnosis not present

## 2014-10-09 DIAGNOSIS — E291 Testicular hypofunction: Secondary | ICD-10-CM | POA: Diagnosis not present

## 2014-10-09 DIAGNOSIS — M62838 Other muscle spasm: Secondary | ICD-10-CM | POA: Diagnosis not present

## 2014-10-09 DIAGNOSIS — F419 Anxiety disorder, unspecified: Secondary | ICD-10-CM | POA: Diagnosis not present

## 2014-10-09 DIAGNOSIS — Z6829 Body mass index (BMI) 29.0-29.9, adult: Secondary | ICD-10-CM | POA: Diagnosis not present

## 2014-10-09 DIAGNOSIS — G8929 Other chronic pain: Secondary | ICD-10-CM | POA: Diagnosis not present

## 2014-10-23 DIAGNOSIS — E291 Testicular hypofunction: Secondary | ICD-10-CM | POA: Diagnosis not present

## 2014-10-23 DIAGNOSIS — E785 Hyperlipidemia, unspecified: Secondary | ICD-10-CM | POA: Diagnosis not present

## 2014-10-23 DIAGNOSIS — I10 Essential (primary) hypertension: Secondary | ICD-10-CM | POA: Diagnosis not present

## 2014-10-23 DIAGNOSIS — Z125 Encounter for screening for malignant neoplasm of prostate: Secondary | ICD-10-CM | POA: Diagnosis not present

## 2014-11-06 DIAGNOSIS — E291 Testicular hypofunction: Secondary | ICD-10-CM | POA: Diagnosis not present

## 2014-11-07 DIAGNOSIS — G894 Chronic pain syndrome: Secondary | ICD-10-CM | POA: Diagnosis not present

## 2014-11-07 DIAGNOSIS — Z5181 Encounter for therapeutic drug level monitoring: Secondary | ICD-10-CM | POA: Diagnosis not present

## 2014-11-07 DIAGNOSIS — G890 Central pain syndrome: Secondary | ICD-10-CM | POA: Diagnosis not present

## 2014-11-07 DIAGNOSIS — Z79899 Other long term (current) drug therapy: Secondary | ICD-10-CM | POA: Diagnosis not present

## 2014-11-07 DIAGNOSIS — G8250 Quadriplegia, unspecified: Secondary | ICD-10-CM | POA: Diagnosis not present

## 2014-11-07 DIAGNOSIS — R252 Cramp and spasm: Secondary | ICD-10-CM | POA: Diagnosis not present

## 2014-11-17 DIAGNOSIS — Z6828 Body mass index (BMI) 28.0-28.9, adult: Secondary | ICD-10-CM | POA: Diagnosis not present

## 2014-11-17 DIAGNOSIS — G4700 Insomnia, unspecified: Secondary | ICD-10-CM | POA: Diagnosis not present

## 2014-11-17 DIAGNOSIS — F419 Anxiety disorder, unspecified: Secondary | ICD-10-CM | POA: Diagnosis not present

## 2014-11-20 DIAGNOSIS — E291 Testicular hypofunction: Secondary | ICD-10-CM | POA: Diagnosis not present

## 2014-12-04 DIAGNOSIS — E291 Testicular hypofunction: Secondary | ICD-10-CM | POA: Diagnosis not present

## 2014-12-10 DIAGNOSIS — M4715 Other spondylosis with myelopathy, thoracolumbar region: Secondary | ICD-10-CM | POA: Diagnosis not present

## 2014-12-18 DIAGNOSIS — Z1389 Encounter for screening for other disorder: Secondary | ICD-10-CM | POA: Diagnosis not present

## 2014-12-18 DIAGNOSIS — G4700 Insomnia, unspecified: Secondary | ICD-10-CM | POA: Diagnosis not present

## 2014-12-18 DIAGNOSIS — F419 Anxiety disorder, unspecified: Secondary | ICD-10-CM | POA: Diagnosis not present

## 2014-12-18 DIAGNOSIS — Z6829 Body mass index (BMI) 29.0-29.9, adult: Secondary | ICD-10-CM | POA: Diagnosis not present

## 2014-12-18 DIAGNOSIS — E291 Testicular hypofunction: Secondary | ICD-10-CM | POA: Diagnosis not present

## 2014-12-31 DIAGNOSIS — R252 Cramp and spasm: Secondary | ICD-10-CM | POA: Diagnosis not present

## 2014-12-31 DIAGNOSIS — G894 Chronic pain syndrome: Secondary | ICD-10-CM | POA: Diagnosis not present

## 2014-12-31 DIAGNOSIS — G890 Central pain syndrome: Secondary | ICD-10-CM | POA: Diagnosis not present

## 2014-12-31 DIAGNOSIS — M961 Postlaminectomy syndrome, not elsewhere classified: Secondary | ICD-10-CM | POA: Diagnosis not present

## 2015-01-01 DIAGNOSIS — E291 Testicular hypofunction: Secondary | ICD-10-CM | POA: Diagnosis not present

## 2015-01-15 DIAGNOSIS — E291 Testicular hypofunction: Secondary | ICD-10-CM | POA: Diagnosis not present

## 2015-01-15 DIAGNOSIS — G4700 Insomnia, unspecified: Secondary | ICD-10-CM | POA: Diagnosis not present

## 2015-01-15 DIAGNOSIS — Z6828 Body mass index (BMI) 28.0-28.9, adult: Secondary | ICD-10-CM | POA: Diagnosis not present

## 2015-01-15 DIAGNOSIS — F419 Anxiety disorder, unspecified: Secondary | ICD-10-CM | POA: Diagnosis not present

## 2015-01-22 DIAGNOSIS — Z6827 Body mass index (BMI) 27.0-27.9, adult: Secondary | ICD-10-CM | POA: Diagnosis not present

## 2015-01-22 DIAGNOSIS — S32009K Unspecified fracture of unspecified lumbar vertebra, subsequent encounter for fracture with nonunion: Secondary | ICD-10-CM | POA: Diagnosis not present

## 2015-01-22 DIAGNOSIS — I10 Essential (primary) hypertension: Secondary | ICD-10-CM | POA: Diagnosis not present

## 2015-01-28 ENCOUNTER — Other Ambulatory Visit: Payer: Self-pay | Admitting: Neurological Surgery

## 2015-01-29 DIAGNOSIS — E291 Testicular hypofunction: Secondary | ICD-10-CM | POA: Diagnosis not present

## 2015-02-11 DIAGNOSIS — E291 Testicular hypofunction: Secondary | ICD-10-CM | POA: Diagnosis not present

## 2015-02-12 ENCOUNTER — Encounter (HOSPITAL_COMMUNITY): Payer: Self-pay

## 2015-02-12 ENCOUNTER — Encounter (HOSPITAL_COMMUNITY)
Admission: RE | Admit: 2015-02-12 | Discharge: 2015-02-12 | Disposition: A | Discharge status: Home / Self Care | Payer: Medicare Other | Source: Ambulatory Visit | Attending: Neurological Surgery | Admitting: Neurological Surgery

## 2015-02-12 DIAGNOSIS — M960 Pseudarthrosis after fusion or arthrodesis: Secondary | ICD-10-CM | POA: Insufficient documentation

## 2015-02-12 DIAGNOSIS — Z01812 Encounter for preprocedural laboratory examination: Secondary | ICD-10-CM | POA: Diagnosis not present

## 2015-02-12 DIAGNOSIS — Z79899 Other long term (current) drug therapy: Secondary | ICD-10-CM | POA: Insufficient documentation

## 2015-02-12 HISTORY — DX: Myoneural disorder, unspecified: G70.9

## 2015-02-12 LAB — CBC
HEMATOCRIT: 46.8 % (ref 39.0–52.0)
HEMOGLOBIN: 15.9 g/dL (ref 13.0–17.0)
MCH: 30.2 pg (ref 26.0–34.0)
MCHC: 34 g/dL (ref 30.0–36.0)
MCV: 88.8 fL (ref 78.0–100.0)
Platelets: 230 10*3/uL (ref 150–400)
RBC: 5.27 MIL/uL (ref 4.22–5.81)
RDW: 13.8 % (ref 11.5–15.5)
WBC: 9 10*3/uL (ref 4.0–10.5)

## 2015-02-12 LAB — BASIC METABOLIC PANEL
ANION GAP: 8 (ref 5–15)
BUN: 22 mg/dL — ABNORMAL HIGH (ref 6–20)
CALCIUM: 9.4 mg/dL (ref 8.9–10.3)
CHLORIDE: 98 mmol/L — AB (ref 101–111)
CO2: 30 mmol/L (ref 22–32)
Creatinine, Ser: 1.53 mg/dL — ABNORMAL HIGH (ref 0.61–1.24)
GFR calc Af Amer: 54 mL/min — ABNORMAL LOW (ref >60)
GFR calc non Af Amer: 47 mL/min — ABNORMAL LOW (ref >60)
Glucose, Bld: 94 mg/dL (ref 65–99)
POTASSIUM: 3.8 mmol/L (ref 3.5–5.1)
Sodium: 136 mmol/L (ref 135–145)

## 2015-02-12 LAB — SURGICAL PCR SCREEN
MRSA, PCR: NEGATIVE
Staphylococcus aureus: NEGATIVE

## 2015-02-12 NOTE — Progress Notes (Signed)
pcp is Dr Foye Deer Denies seeing a cardiologist Denies ever having a card cath or echo States he had a stress test 6 years ago in [facility]- request sent for report.

## 2015-02-12 NOTE — Pre-Procedure Instructions (Signed)
Dennis Cochran  02/12/2015      [facility] - [place] - [address] [place] [address] Phone: [phone] Fax: [phone]    Your procedure is scheduled on Oct 17  Report to [facility] Admitting at 700  A.M.  Call this number if you have problems the morning of surgery:  [phone]   Remember:  Do not eat food or drink liquids after midnight.  Take these medicines the morning of surgery with A SIP OF WATER  Baclofen (Lioresal) if needed, bupropion (Wellbutrin SR) Morphine (MS Contin), oxycodone-acetaminophen (percocet) if needed  Stop taking Asprin, Ibuprofen, Aleve, BC's, Goody's, Herbal medications, Fish Oil   Do not wear jewelry, make-up or nail polish.  Do not wear lotions, powders, or perfumes.  You may wear deodorant.  Do not shave 48 hours prior to surgery.  Men may shave face and neck.  Do not bring valuables to the hospital.  [facility] is not responsible for any belongings or valuables.  Contacts, dentures or bridgework may not be worn into surgery.  Leave your suitcase in the car.  After surgery it may be brought to your room.  For patients admitted to the hospital, discharge time will be determined by your treatment team.  Patients discharged the day of surgery will not be allowed to drive home.    Special instructions:  [place] - Preparing for Surgery  Before surgery, you can play an important role.  Because skin is not sterile, your skin needs to be as free of germs as possible.  You can reduce the number of germs on you skin by washing with CHG (chlorahexidine gluconate) soap before surgery.  CHG is an antiseptic cleaner which kills germs and bonds with the skin to continue killing germs even after washing.  Please DO NOT use if you have an allergy to CHG or antibacterial soaps.  If your skin becomes reddened/irritated stop using the CHG and inform your nurse when you arrive at Short  Stay.  Do not shave (including legs and underarms) for at least 48 hours prior to the first CHG shower.  You may shave your face.  Please follow these instructions carefully:   1.  Shower with CHG Soap the night before surgery and the   morning of Surgery.  2.  If you choose to wash your hair, wash your hair first as usual with your   normal shampoo.  3.  After you shampoo, rinse your hair and body thoroughly to remove the   Shampoo.  4.  Use CHG as you would any other liquid soap.  You can apply chg directly   to the skin and wash gently with scrungie or a clean washcloth.  5.  Apply the CHG Soap to your body ONLY FROM THE NECK DOWN.   Do not use on open wounds or open sores.  Avoid contact with your eyes,  ears, mouth and genitals (private parts).  Wash genitals (private parts)  with your normal soap.  6.  Wash thoroughly, paying special attention to the area where your surgery  will be performed.  7.  Thoroughly rinse your body with warm water from the neck down.  8.  DO NOT shower/wash with your normal soap after using and rinsing off  the CHG Soap.  9.  Pat yourself dry with a clean towel.  10.  Wear clean pajamas.            11.  Place clean sheets on your bed the night of your first shower and do not  sleep with pets.  Day of Surgery  Do not apply any lotions/deoderants the morning of surgery.  Please wear clean clothes to the hospital/surgery center.     Please read over the following fact sheets that you were given. Pain Booklet, Coughing and Deep Breathing, MRSA Information and Surgical Site Infection Prevention

## 2015-02-20 DIAGNOSIS — G894 Chronic pain syndrome: Secondary | ICD-10-CM | POA: Diagnosis not present

## 2015-02-20 DIAGNOSIS — G890 Central pain syndrome: Secondary | ICD-10-CM | POA: Diagnosis not present

## 2015-02-20 DIAGNOSIS — R252 Cramp and spasm: Secondary | ICD-10-CM | POA: Diagnosis not present

## 2015-02-20 DIAGNOSIS — S14105S Unspecified injury at C5 level of cervical spinal cord, sequela: Secondary | ICD-10-CM | POA: Diagnosis not present

## 2015-02-22 MED ORDER — CEFAZOLIN SODIUM-DEXTROSE 2-3 GM-% IV SOLR
2.0000 g | INTRAVENOUS | Status: AC
  Administered 2015-02-23: 2 g via INTRAVENOUS
  Filled 2015-02-22: qty 50

## 2015-02-23 ENCOUNTER — Inpatient Hospital Stay (HOSPITAL_COMMUNITY): Payer: Medicare Other

## 2015-02-23 ENCOUNTER — Encounter (HOSPITAL_COMMUNITY): Payer: Self-pay | Admitting: Anesthesiology

## 2015-02-23 ENCOUNTER — Encounter (HOSPITAL_COMMUNITY)
Admission: RE | Disposition: A | Discharge status: Home / Self Care | Payer: Self-pay | Source: Home / Self Care | Attending: Neurological Surgery

## 2015-02-23 ENCOUNTER — Inpatient Hospital Stay (HOSPITAL_COMMUNITY)
Admission: RE | Admit: 2015-02-23 | Discharge: 2015-02-23 | DRG: 497 | Disposition: A | Discharge status: Home / Self Care | Payer: Medicare Other | Attending: Neurological Surgery | Admitting: Neurological Surgery

## 2015-02-23 ENCOUNTER — Inpatient Hospital Stay (HOSPITAL_COMMUNITY): Payer: Medicare Other | Admitting: Anesthesiology

## 2015-02-23 DIAGNOSIS — E785 Hyperlipidemia, unspecified: Secondary | ICD-10-CM | POA: Diagnosis present

## 2015-02-23 DIAGNOSIS — Z472 Encounter for removal of internal fixation device: Secondary | ICD-10-CM | POA: Diagnosis not present

## 2015-02-23 DIAGNOSIS — F419 Anxiety disorder, unspecified: Secondary | ICD-10-CM | POA: Diagnosis present

## 2015-02-23 DIAGNOSIS — F410 Panic disorder [episodic paroxysmal anxiety] without agoraphobia: Secondary | ICD-10-CM | POA: Diagnosis present

## 2015-02-23 DIAGNOSIS — I10 Essential (primary) hypertension: Secondary | ICD-10-CM | POA: Diagnosis not present

## 2015-02-23 DIAGNOSIS — Z981 Arthrodesis status: Secondary | ICD-10-CM | POA: Diagnosis not present

## 2015-02-23 DIAGNOSIS — G8929 Other chronic pain: Secondary | ICD-10-CM | POA: Diagnosis present

## 2015-02-23 DIAGNOSIS — Z419 Encounter for procedure for purposes other than remedying health state, unspecified: Secondary | ICD-10-CM

## 2015-02-23 DIAGNOSIS — Z967 Presence of other bone and tendon implants: Secondary | ICD-10-CM

## 2015-02-23 DIAGNOSIS — T84226A Displacement of internal fixation device of vertebrae, initial encounter: Secondary | ICD-10-CM | POA: Diagnosis not present

## 2015-02-23 DIAGNOSIS — M1990 Unspecified osteoarthritis, unspecified site: Secondary | ICD-10-CM | POA: Diagnosis present

## 2015-02-23 DIAGNOSIS — T84218A Breakdown (mechanical) of internal fixation device of other bones, initial encounter: Secondary | ICD-10-CM | POA: Diagnosis not present

## 2015-02-23 DIAGNOSIS — M960 Pseudarthrosis after fusion or arthrodesis: Secondary | ICD-10-CM | POA: Diagnosis not present

## 2015-02-23 HISTORY — PX: LUMBAR LAMINECTOMY/ DECOMPRESSION WITH MET-RX: SHX5959

## 2015-02-23 SURGERY — LUMBAR LAMINECTOMY/ DECOMPRESSION WITH MET-RX
Anesthesia: General | Site: Spine Lumbar

## 2015-02-23 MED ORDER — THROMBIN 5000 UNITS EX SOLR
CUTANEOUS | Status: DC | PRN
  Administered 2015-02-23 (×2): 5000 [IU] via TOPICAL

## 2015-02-23 MED ORDER — GLYCOPYRROLATE 0.2 MG/ML IJ SOLN
INTRAMUSCULAR | Status: AC
  Filled 2015-02-23: qty 2

## 2015-02-23 MED ORDER — LIDOCAINE-EPINEPHRINE 1 %-1:100000 IJ SOLN
INTRAMUSCULAR | Status: DC | PRN
  Administered 2015-02-23: 5 mL

## 2015-02-23 MED ORDER — MIDAZOLAM HCL 2 MG/2ML IJ SOLN
INTRAMUSCULAR | Status: AC
  Filled 2015-02-23: qty 4

## 2015-02-23 MED ORDER — OXYCODONE HCL 5 MG/5ML PO SOLN: 5.0000 mg | Freq: Once | ORAL | Status: DC | PRN

## 2015-02-23 MED ORDER — ONDANSETRON HCL 4 MG/2ML IJ SOLN
INTRAMUSCULAR | Status: AC
  Administered 2015-02-23: 4 mg via INTRAVENOUS
  Filled 2015-02-23: qty 2

## 2015-02-23 MED ORDER — GLYCOPYRROLATE 0.2 MG/ML IJ SOLN
INTRAMUSCULAR | Status: DC | PRN
  Administered 2015-02-23: 0.4 mg via INTRAVENOUS
  Administered 2015-02-23: 0.2 mg via INTRAVENOUS

## 2015-02-23 MED ORDER — DEXAMETHASONE SODIUM PHOSPHATE 10 MG/ML IJ SOLN
INTRAMUSCULAR | Status: AC
  Filled 2015-02-23: qty 1

## 2015-02-23 MED ORDER — HYDROMORPHONE HCL 1 MG/ML IJ SOLN: 0.2500 mg | INTRAMUSCULAR | Status: DC | PRN

## 2015-02-23 MED ORDER — FENTANYL CITRATE (PF) 250 MCG/5ML IJ SOLN
INTRAMUSCULAR | Status: AC
  Filled 2015-02-23: qty 5

## 2015-02-23 MED ORDER — ACETAMINOPHEN 325 MG PO TABS: 325.0000 mg | ORAL_TABLET | ORAL | Status: DC | PRN

## 2015-02-23 MED ORDER — EPHEDRINE SULFATE 50 MG/ML IJ SOLN
INTRAMUSCULAR | Status: AC
  Filled 2015-02-23: qty 1

## 2015-02-23 MED ORDER — SODIUM CHLORIDE 0.9 % IJ SOLN
INTRAMUSCULAR | Status: AC
  Filled 2015-02-23: qty 10

## 2015-02-23 MED ORDER — BUPIVACAINE HCL (PF) 0.5 % IJ SOLN
INTRAMUSCULAR | Status: DC | PRN
  Administered 2015-02-23: 5 mL

## 2015-02-23 MED ORDER — SODIUM CHLORIDE 0.9 % IR SOLN
Status: DC | PRN
  Administered 2015-02-23: 10:00:00

## 2015-02-23 MED ORDER — LIDOCAINE HCL (CARDIAC) 20 MG/ML IV SOLN
INTRAVENOUS | Status: AC
  Filled 2015-02-23: qty 5

## 2015-02-23 MED ORDER — EPHEDRINE SULFATE 50 MG/ML IJ SOLN
INTRAMUSCULAR | Status: DC | PRN
  Administered 2015-02-23 (×4): 10 mg via INTRAVENOUS

## 2015-02-23 MED ORDER — DEXAMETHASONE SODIUM PHOSPHATE 4 MG/ML IJ SOLN
INTRAMUSCULAR | Status: AC
  Filled 2015-02-23: qty 1

## 2015-02-23 MED ORDER — ROCURONIUM BROMIDE 100 MG/10ML IV SOLN
INTRAVENOUS | Status: DC | PRN
  Administered 2015-02-23: 30 mg via INTRAVENOUS
  Administered 2015-02-23: 10 mg via INTRAVENOUS

## 2015-02-23 MED ORDER — ONDANSETRON HCL 4 MG/2ML IJ SOLN
INTRAMUSCULAR | Status: DC | PRN
  Administered 2015-02-23: 4 mg via INTRAVENOUS

## 2015-02-23 MED ORDER — ACETAMINOPHEN 160 MG/5ML PO SOLN
325.0000 mg | ORAL | Status: DC | PRN
  Filled 2015-02-23: qty 20.3

## 2015-02-23 MED ORDER — MIDAZOLAM HCL 5 MG/5ML IJ SOLN
INTRAMUSCULAR | Status: DC | PRN
  Administered 2015-02-23: 2 mg via INTRAVENOUS

## 2015-02-23 MED ORDER — ONDANSETRON HCL 4 MG/2ML IJ SOLN
4.0000 mg | INTRAMUSCULAR | Status: DC | PRN
  Administered 2015-02-23: 4 mg via INTRAVENOUS
  Filled 2015-02-23: qty 2

## 2015-02-23 MED ORDER — 0.9 % SODIUM CHLORIDE (POUR BTL) OPTIME
TOPICAL | Status: DC | PRN
  Administered 2015-02-23: 1000 mL

## 2015-02-23 MED ORDER — NEOSTIGMINE METHYLSULFATE 10 MG/10ML IV SOLN
INTRAVENOUS | Status: DC | PRN
  Administered 2015-02-23: 3 mg via INTRAVENOUS

## 2015-02-23 MED ORDER — FENTANYL CITRATE (PF) 100 MCG/2ML IJ SOLN
INTRAMUSCULAR | Status: DC | PRN
  Administered 2015-02-23: 100 ug via INTRAVENOUS
  Administered 2015-02-23 (×2): 50 ug via INTRAVENOUS

## 2015-02-23 MED ORDER — LACTATED RINGERS IV SOLN
INTRAVENOUS | Status: DC | PRN
  Administered 2015-02-23: 09:00:00 via INTRAVENOUS

## 2015-02-23 MED ORDER — PROPOFOL 10 MG/ML IV BOLUS
INTRAVENOUS | Status: DC | PRN
  Administered 2015-02-23: 150 mg via INTRAVENOUS

## 2015-02-23 MED ORDER — PROPOFOL 10 MG/ML IV BOLUS
INTRAVENOUS | Status: AC
  Filled 2015-02-23: qty 20

## 2015-02-23 MED ORDER — OXYCODONE HCL 5 MG PO TABS: 5.0000 mg | ORAL_TABLET | Freq: Once | ORAL | Status: DC | PRN

## 2015-02-23 MED ORDER — ROCURONIUM BROMIDE 50 MG/5ML IV SOLN
INTRAVENOUS | Status: AC
  Filled 2015-02-23: qty 1

## 2015-02-23 MED ORDER — HEMOSTATIC AGENTS (NO CHARGE) OPTIME
TOPICAL | Status: DC | PRN
  Administered 2015-02-23: 1 via TOPICAL

## 2015-02-23 MED ORDER — ONDANSETRON HCL 4 MG/2ML IJ SOLN
INTRAMUSCULAR | Status: AC
  Filled 2015-02-23: qty 2

## 2015-02-23 MED ORDER — DEXAMETHASONE SODIUM PHOSPHATE 4 MG/ML IJ SOLN
INTRAMUSCULAR | Status: DC | PRN
  Administered 2015-02-23: 4 mg via INTRAVENOUS

## 2015-02-23 MED ORDER — LIDOCAINE HCL (CARDIAC) 20 MG/ML IV SOLN
INTRAVENOUS | Status: DC | PRN
  Administered 2015-02-23: 100 mg via INTRAVENOUS

## 2015-02-23 SURGICAL SUPPLY — 49 items
ADH SKN CLS APL DERMABOND .7 (GAUZE/BANDAGES/DRESSINGS) ×1
BAG DECANTER FOR FLEXI CONT (MISCELLANEOUS) ×3 IMPLANT
BLADE CLIPPER SURG (BLADE) IMPLANT
BLADE SURG 15 STRL LF DISP TIS (BLADE) ×1 IMPLANT
BLADE SURG 15 STRL SS (BLADE) ×3
BUR 2.5 MTCH HD 16 (BUR) ×2 IMPLANT
BUR 2.5MM MTCH HD 16CM (BUR) ×1
CANISTER SUCT 3000ML PPV (MISCELLANEOUS) ×3 IMPLANT
DECANTER SPIKE VIAL GLASS SM (MISCELLANEOUS) ×3 IMPLANT
DERMABOND ADVANCED (GAUZE/BANDAGES/DRESSINGS) ×2
DERMABOND ADVANCED .7 DNX12 (GAUZE/BANDAGES/DRESSINGS) ×1 IMPLANT
DRAPE C-ARM 42X72 X-RAY (DRAPES) ×6 IMPLANT
DRAPE LAPAROTOMY 100X72 PEDS (DRAPES) ×3 IMPLANT
DRAPE MICROSCOPE LEICA (MISCELLANEOUS) ×3 IMPLANT
DRAPE POUCH INSTRU U-SHP 10X18 (DRAPES) ×3 IMPLANT
DURAPREP 6ML APPLICATOR 50/CS (WOUND CARE) ×3 IMPLANT
ELECT BLADE 6.5 EXT (BLADE) ×3 IMPLANT
ELECT REM PT RETURN 9FT ADLT (ELECTROSURGICAL) ×3
ELECTRODE REM PT RTRN 9FT ADLT (ELECTROSURGICAL) ×1 IMPLANT
GAUZE SPONGE 4X4 16PLY XRAY LF (GAUZE/BANDAGES/DRESSINGS) IMPLANT
GLOVE BIOGEL PI IND STRL 8.5 (GLOVE) ×1 IMPLANT
GLOVE BIOGEL PI INDICATOR 8.5 (GLOVE) ×2
GLOVE ECLIPSE 8.5 STRL (GLOVE) ×3 IMPLANT
GLOVE EXAM NITRILE LRG STRL (GLOVE) IMPLANT
GLOVE EXAM NITRILE MD LF STRL (GLOVE) IMPLANT
GLOVE EXAM NITRILE XL STR (GLOVE) IMPLANT
GLOVE EXAM NITRILE XS STR PU (GLOVE) IMPLANT
GOWN STRL REUS W/ TWL LRG LVL3 (GOWN DISPOSABLE) IMPLANT
GOWN STRL REUS W/ TWL XL LVL3 (GOWN DISPOSABLE) IMPLANT
GOWN STRL REUS W/TWL 2XL LVL3 (GOWN DISPOSABLE) ×3 IMPLANT
GOWN STRL REUS W/TWL LRG LVL3 (GOWN DISPOSABLE)
GOWN STRL REUS W/TWL XL LVL3 (GOWN DISPOSABLE)
KIT BASIN OR (CUSTOM PROCEDURE TRAY) ×3 IMPLANT
KIT ROOM TURNOVER OR (KITS) ×3 IMPLANT
NDL HYPO 18GX1.5 BLUNT FILL (NEEDLE) IMPLANT
NDL SPNL 20GX3.5 QUINCKE YW (NEEDLE) IMPLANT
NEEDLE HYPO 18GX1.5 BLUNT FILL (NEEDLE) IMPLANT
NEEDLE HYPO 22GX1.5 SAFETY (NEEDLE) ×3 IMPLANT
NEEDLE SPNL 20GX3.5 QUINCKE YW (NEEDLE) IMPLANT
NS IRRIG 1000ML POUR BTL (IV SOLUTION) ×3 IMPLANT
PACK LAMINECTOMY NEURO (CUSTOM PROCEDURE TRAY) ×3 IMPLANT
PAD ARMBOARD 7.5X6 YLW CONV (MISCELLANEOUS) ×9 IMPLANT
RUBBERBAND STERILE (MISCELLANEOUS) ×6 IMPLANT
SPONGE SURGIFOAM ABS GEL SZ50 (HEMOSTASIS) ×3 IMPLANT
SUT VIC AB 3-0 SH 8-18 (SUTURE) ×3 IMPLANT
SYR 5ML LL (SYRINGE) IMPLANT
TOWEL OR 17X24 6PK STRL BLUE (TOWEL DISPOSABLE) ×3 IMPLANT
TOWEL OR 17X26 10 PK STRL BLUE (TOWEL DISPOSABLE) ×3 IMPLANT
WATER STERILE IRR 1000ML POUR (IV SOLUTION) ×3 IMPLANT

## 2015-02-23 NOTE — Progress Notes (Signed)
Call to Dr. Maple HudsonMoser, reported Creatinine level seen on PAT labs.

## 2015-02-23 NOTE — Transfer of Care (Signed)
Immediate Anesthesia Transfer of Care Note  Patient: Dennis Cochran  Procedure(s) Performed: Procedure(s) with comments: Lumbar spine Hardware removal with Metrex Lumbar Two-Four (N/A) - Lumbar spine Hardware removal with Metrex  Patient Location: PACU  Anesthesia Type:General  Level of Consciousness: awake, alert  and oriented  Airway & Oxygen Therapy: Patient Spontanous Breathing and Patient connected to nasal cannula oxygen  Post-op Assessment: Report given to RN, Post -op Vital signs reviewed and stable and Patient moving all extremities X 4  Post vital signs: Reviewed and stable  Last Vitals:  Filed Vitals:   02/23/15 1040  BP:   Pulse:   Temp: 36.4 C  Resp:     Complications: No apparent anesthesia complications

## 2015-02-23 NOTE — Discharge Summary (Signed)
Physician Discharge Summary  Patient ID: Dennis ChristiansKent M Roedl MRN: [id number] DOB/AGE: [birth year] 63 y.o.  Admit date: 02/23/2015 Discharge date: 02/23/2015  Admission Diagnoses: Back pain, loose hardware in back  Discharge Diagnoses: Back pain, loose hardware in back Active Problems:   Fixation hardware in spine   Discharged Condition: good  Hospital Course: Patient underwent surgical extraction of his posterior hardware from L2-L4. He tolerated it well  Consults: None  Significant Diagnostic Studies: None  Treatments: Removal of hardware from L2-L4 using Metrix tube retractor, fluoroscopic visualization  Discharge Exam: Blood pressure 131/56, pulse 72, temperature 97.6 F (36.4 C), resp. rate 9, height 5' 8.5" (1.74 m), weight 81.647 kg (180 lb), SpO2 98 %. Motor function reveals a spastic gait which has been chronic. Incisions are clean and dry  Disposition: 06-Home-Health Care Svc  Discharge Instructions    Call MD for:  redness, tenderness, or signs of infection (pain, swelling, redness, odor or green/yellow discharge around incision site)    Complete by:  As directed      Call MD for:  severe uncontrolled pain    Complete by:  As directed      Call MD for:  temperature >100.4    Complete by:  As directed      Diet - low sodium heart healthy    Complete by:  As directed      Discharge instructions    Complete by:  As directed   Okay to shower. Do not apply salves or appointments to incision. No heavy lifting with the upper extremities greater than 15 pounds. May resume driving when not requiring pain medication and patient feels comfortable with doing so.     Increase activity slowly    Complete by:  As directed             Medication List    TAKE these medications        baclofen 20 MG tablet  Commonly known as:  LIORESAL  Take 10 mg by mouth 2 (two) times daily as needed for muscle spasms. Pt takes 10 mg every morning and may take an additional 10mg  at night if  needed     buPROPion 150 MG 12 hr tablet  Commonly known as:  WELLBUTRIN SR  Take 150 mg by mouth 2 (two) times daily.     desonide 0.05 % cream  Commonly known as:  DESOWEN  Apply 1 application topically 2 (two) times daily.     EDARBYCLOR 40-12.5 MG Tabs  Generic drug:  Azilsartan-Chlorthalidone  Take 1 tablet by mouth every morning.     gemfibrozil 600 MG tablet  Commonly known as:  LOPID  Take 600 mg by mouth every morning.     KLONOPIN 1 MG tablet  Generic drug:  clonazePAM  Take 1-1.5 mg by mouth at bedtime. Pt takes 1 tablet to 1.5 tablets at night for sleep     morphine 60 MG 12 hr tablet  Commonly known as:  MS CONTIN  Take 60 mg by mouth 3 (three) times daily.     NOSTRILLA 12 HOUR NA  Place 1 spray into the nose 2 (two) times daily as needed (for allergies, rhinitis).     oxyCODONE-acetaminophen 10-325 MG tablet  Commonly known as:  PERCOCET  Take 1 tablet by mouth every 8 (eight) hours as needed for pain.     testosterone enanthate 200 MG/ML injection  Commonly known as:  DELATESTRYL  Inject 200 mg into the muscle every 14 (fourteen) days.  traZODone 50 MG tablet  Commonly known as:  DESYREL  Take 50 mg by mouth at bedtime.         SignedStefani Dama 02/23/2015, 11:38 AM
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
## 2015-02-23 NOTE — Anesthesia Preprocedure Evaluation (Addendum)
Anesthesia Evaluation  Patient identified by MRN, date of birth, ID band Patient awake    Reviewed: Allergy & Precautions, NPO status , Patient's Chart, lab work & pertinent test results  History of Anesthesia Complications Negative for: history of anesthetic complications  Airway Mallampati: II  TM Distance: >3 FB Neck ROM: Full    Dental  (+) Teeth Intact   Pulmonary neg pulmonary ROS,    breath sounds clear to auscultation       Cardiovascular hypertension, Pt. on medications (-) angina(-) Past MI and (-) CHF  Rhythm:Regular     Neuro/Psych  Headaches, neg Seizures PSYCHIATRIC DISORDERS Anxiety  Neuromuscular disease    GI/Hepatic negative GI ROS, Neg liver ROS,   Endo/Other  negative endocrine ROS  Renal/GU Renal InsufficiencyRenal disease     Musculoskeletal  (+) Arthritis ,   Abdominal   Peds  Hematology negative hematology ROS (+)   Anesthesia Other Findings   Reproductive/Obstetrics                            Anesthesia Physical Anesthesia Plan  ASA: III  Anesthesia Plan: General   Post-op Pain Management:    Induction: Intravenous  Airway Management Planned: Oral ETT  Additional Equipment: None  Intra-op Plan:   Post-operative Plan: Extubation in OR  Informed Consent: I have reviewed the patients History and Physical, chart, labs and discussed the procedure including the risks, benefits and alternatives for the proposed anesthesia with the patient or authorized representative who has indicated his/her understanding and acceptance.   Dental advisory given  Plan Discussed with: CRNA and Surgeon  Anesthesia Plan Comments:        Anesthesia Quick Evaluation

## 2015-02-23 NOTE — Progress Notes (Signed)
Arriving in phase 2

## 2015-02-23 NOTE — Anesthesia Procedure Notes (Signed)
Procedure Name: Intubation Date/Time: 02/23/2015 9:12 AM Performed by: Sharlene DoryWALKER, Luan Maberry E Pre-anesthesia Checklist: Patient identified, Emergency Drugs available, Suction available, Patient being monitored and Timeout performed Patient Re-evaluated:Patient Re-evaluated prior to inductionOxygen Delivery Method: Circle system utilized Preoxygenation: Pre-oxygenation with 100% oxygen Intubation Type: IV induction Ventilation: Mask ventilation without difficulty Laryngoscope Size: Mac and 4 Grade View: Grade II Tube type: Oral Tube size: 7.5 mm Number of attempts: 1 Airway Equipment and Method: Stylet Placement Confirmation: ETT inserted through vocal cords under direct vision,  positive ETCO2 and breath sounds checked- equal and bilateral Secured at: 22 cm Tube secured with: Tape Dental Injury: Teeth and Oropharynx as per pre-operative assessment

## 2015-02-23 NOTE — Op Note (Signed)
Date of surgery: 02/23/2015 Preoperative diagnosis: Status post arthrodesis L2-L4 for lumbar spinal stenosis, hardware loosening, back pain. Postoperative diagnosis: Status post arthrodesis L2-L4 for lumbar spinal stenosis, hardware loosening, back pain Procedure: Removal of posterior instrumentation from L2-L4 using Metrix tube dissector.  Surgeon: Barnett AbuHenry Daiton Cowles Anesthesia: Gen. endotracheal In dictations: Can Katrinka BlazingSmith is a 64 year old individual who's had severe chronic pain secondary to a spinal cord injury 27 years ago. He was having increasing weakness in his legs and was found to have severe stenosis from L2-L4. Surgical decompression and fusion was performed in January. He's had progressively increasing and worsening back pain. Those fusion appears to be healing well, he has loosening of the hardware at L2. He is advised regarding removal of the hardware in an effort to control his pain.  Procedure: The patient was brought to the operating room supine on a stretcher. After the smooth induction of general endotracheal anesthesia, he was turned prone. The back was prepped with alcohol DuraPrep and draped in a sterile fashion. Fluoroscopic guidance was used to localize the hardware first on the left-hand side. The L3 screw was localized particularly. The skin was infiltrated with lidocaine and epinephrine mixed with Marcaine. A vertical incision was created. A K wire was passed to the medial portion of the head of L3. Then using a wanting technique a series of dilators were passed over this region to the 18 mm size. Through this aperture then was able to cauterize the fascia overlying the head of the screw and remove the screw. This procedure was then repeated to all the other screws isolating the screw heads and removing the screw caps. Then a portion of the distance between the L2 and the L3 screws was cleared and the soft tissues were removed. The rod was grabbed with a rod holder and it was removed  through same incision. Then each of the individual screws were removed again through the tube retractor. This was first done on the left side and on the right side. Once all hardware was removed confirmation of complete removal was obtained with fluoroscopy. When this was verified, the fascia was then closed with 20 by: Interrupted fashion and 30 Vicryls used in the subcutaneous take her skin. Patient was returned to recovery room in stable condition. Blood loss is estimated at less than 25 mL.

## 2015-02-24 ENCOUNTER — Encounter (HOSPITAL_COMMUNITY): Payer: Self-pay | Admitting: Neurological Surgery

## 2015-02-26 NOTE — Anesthesia Postprocedure Evaluation (Signed)
  Anesthesia Post-op Note  Patient: Dennis Cochran  Procedure(s) Performed: Procedure(s) with comments: Lumbar spine Hardware removal with Metrex Lumbar Two-Four (N/A) - Lumbar spine Hardware removal with Metrex  Patient Location: PACU  Anesthesia Type:General  Level of Consciousness: awake  Airway and Oxygen Therapy: Patient Spontanous Breathing  Post-op Pain: mild  Post-op Assessment: Post-op Vital signs reviewed, Patient's Cardiovascular Status Stable, Respiratory Function Stable, Patent Airway, No signs of Nausea or vomiting and Pain level controlled              Post-op Vital Signs: Reviewed and stable  Last Vitals:  Filed Vitals:   02/23/15 1312  BP: 137/62  Pulse: 75  Temp:   Resp:     Complications: No apparent anesthesia complications

## 2015-03-04 DIAGNOSIS — E291 Testicular hypofunction: Secondary | ICD-10-CM | POA: Diagnosis not present

## 2015-03-18 DIAGNOSIS — I10 Essential (primary) hypertension: Secondary | ICD-10-CM | POA: Diagnosis not present

## 2015-03-18 DIAGNOSIS — Z6827 Body mass index (BMI) 27.0-27.9, adult: Secondary | ICD-10-CM | POA: Diagnosis not present

## 2015-03-18 DIAGNOSIS — E785 Hyperlipidemia, unspecified: Secondary | ICD-10-CM | POA: Diagnosis not present

## 2015-03-18 DIAGNOSIS — E291 Testicular hypofunction: Secondary | ICD-10-CM | POA: Diagnosis not present

## 2015-03-18 DIAGNOSIS — G4700 Insomnia, unspecified: Secondary | ICD-10-CM | POA: Diagnosis not present

## 2015-03-18 DIAGNOSIS — Z23 Encounter for immunization: Secondary | ICD-10-CM | POA: Diagnosis not present

## 2015-03-18 DIAGNOSIS — F419 Anxiety disorder, unspecified: Secondary | ICD-10-CM | POA: Diagnosis not present

## 2015-04-01 DIAGNOSIS — E291 Testicular hypofunction: Secondary | ICD-10-CM | POA: Diagnosis not present

## 2015-04-15 DIAGNOSIS — E291 Testicular hypofunction: Secondary | ICD-10-CM | POA: Diagnosis not present

## 2015-04-29 DIAGNOSIS — E291 Testicular hypofunction: Secondary | ICD-10-CM | POA: Diagnosis not present

## 2015-05-14 DIAGNOSIS — Z6828 Body mass index (BMI) 28.0-28.9, adult: Secondary | ICD-10-CM | POA: Diagnosis not present

## 2015-05-14 DIAGNOSIS — G4700 Insomnia, unspecified: Secondary | ICD-10-CM | POA: Diagnosis not present

## 2015-05-14 DIAGNOSIS — F419 Anxiety disorder, unspecified: Secondary | ICD-10-CM | POA: Diagnosis not present

## 2015-05-14 DIAGNOSIS — E291 Testicular hypofunction: Secondary | ICD-10-CM | POA: Diagnosis not present

## 2015-05-21 DIAGNOSIS — Z79899 Other long term (current) drug therapy: Secondary | ICD-10-CM | POA: Diagnosis not present

## 2015-05-21 DIAGNOSIS — S14105S Unspecified injury at C5 level of cervical spinal cord, sequela: Secondary | ICD-10-CM | POA: Diagnosis not present

## 2015-05-21 DIAGNOSIS — R252 Cramp and spasm: Secondary | ICD-10-CM | POA: Diagnosis not present

## 2015-05-21 DIAGNOSIS — M961 Postlaminectomy syndrome, not elsewhere classified: Secondary | ICD-10-CM | POA: Diagnosis not present

## 2015-05-21 DIAGNOSIS — G894 Chronic pain syndrome: Secondary | ICD-10-CM | POA: Diagnosis not present

## 2015-05-21 DIAGNOSIS — Z5181 Encounter for therapeutic drug level monitoring: Secondary | ICD-10-CM | POA: Diagnosis not present

## 2015-05-28 DIAGNOSIS — E291 Testicular hypofunction: Secondary | ICD-10-CM | POA: Diagnosis not present

## 2015-06-11 DIAGNOSIS — E291 Testicular hypofunction: Secondary | ICD-10-CM | POA: Diagnosis not present

## 2015-06-17 DIAGNOSIS — Z6827 Body mass index (BMI) 27.0-27.9, adult: Secondary | ICD-10-CM | POA: Diagnosis not present

## 2015-06-17 DIAGNOSIS — S32009K Unspecified fracture of unspecified lumbar vertebra, subsequent encounter for fracture with nonunion: Secondary | ICD-10-CM | POA: Diagnosis not present

## 2015-06-17 DIAGNOSIS — I10 Essential (primary) hypertension: Secondary | ICD-10-CM | POA: Diagnosis not present

## 2015-06-24 DIAGNOSIS — E291 Testicular hypofunction: Secondary | ICD-10-CM | POA: Diagnosis not present

## 2015-06-24 DIAGNOSIS — E785 Hyperlipidemia, unspecified: Secondary | ICD-10-CM | POA: Diagnosis not present

## 2015-06-24 DIAGNOSIS — J309 Allergic rhinitis, unspecified: Secondary | ICD-10-CM | POA: Diagnosis not present

## 2015-06-24 DIAGNOSIS — F419 Anxiety disorder, unspecified: Secondary | ICD-10-CM | POA: Diagnosis not present

## 2015-06-24 DIAGNOSIS — Z6828 Body mass index (BMI) 28.0-28.9, adult: Secondary | ICD-10-CM | POA: Diagnosis not present

## 2015-06-24 DIAGNOSIS — I10 Essential (primary) hypertension: Secondary | ICD-10-CM | POA: Diagnosis not present

## 2015-07-08 DIAGNOSIS — E291 Testicular hypofunction: Secondary | ICD-10-CM | POA: Diagnosis not present

## 2015-07-22 DIAGNOSIS — E291 Testicular hypofunction: Secondary | ICD-10-CM | POA: Diagnosis not present

## 2015-07-30 DIAGNOSIS — G890 Central pain syndrome: Secondary | ICD-10-CM | POA: Diagnosis not present

## 2015-07-30 DIAGNOSIS — M961 Postlaminectomy syndrome, not elsewhere classified: Secondary | ICD-10-CM | POA: Diagnosis not present

## 2015-07-30 DIAGNOSIS — G894 Chronic pain syndrome: Secondary | ICD-10-CM | POA: Diagnosis not present

## 2015-07-30 DIAGNOSIS — S14105S Unspecified injury at C5 level of cervical spinal cord, sequela: Secondary | ICD-10-CM | POA: Diagnosis not present

## 2015-08-05 DIAGNOSIS — E291 Testicular hypofunction: Secondary | ICD-10-CM | POA: Diagnosis not present

## 2015-08-19 DIAGNOSIS — E291 Testicular hypofunction: Secondary | ICD-10-CM | POA: Diagnosis not present

## 2015-09-02 DIAGNOSIS — E291 Testicular hypofunction: Secondary | ICD-10-CM | POA: Diagnosis not present

## 2015-09-16 DIAGNOSIS — E291 Testicular hypofunction: Secondary | ICD-10-CM | POA: Diagnosis not present

## 2015-09-23 DIAGNOSIS — J309 Allergic rhinitis, unspecified: Secondary | ICD-10-CM | POA: Diagnosis not present

## 2015-09-23 DIAGNOSIS — E291 Testicular hypofunction: Secondary | ICD-10-CM | POA: Diagnosis not present

## 2015-09-23 DIAGNOSIS — Z6828 Body mass index (BMI) 28.0-28.9, adult: Secondary | ICD-10-CM | POA: Diagnosis not present

## 2015-09-23 DIAGNOSIS — I10 Essential (primary) hypertension: Secondary | ICD-10-CM | POA: Diagnosis not present

## 2015-09-23 DIAGNOSIS — F419 Anxiety disorder, unspecified: Secondary | ICD-10-CM | POA: Diagnosis not present

## 2015-09-23 DIAGNOSIS — E785 Hyperlipidemia, unspecified: Secondary | ICD-10-CM | POA: Diagnosis not present

## 2015-09-30 DIAGNOSIS — S40911A Unspecified superficial injury of right shoulder, initial encounter: Secondary | ICD-10-CM | POA: Diagnosis not present

## 2015-09-30 DIAGNOSIS — E291 Testicular hypofunction: Secondary | ICD-10-CM | POA: Diagnosis not present

## 2015-10-14 DIAGNOSIS — M4806 Spinal stenosis, lumbar region: Secondary | ICD-10-CM | POA: Diagnosis not present

## 2015-10-14 DIAGNOSIS — M9982 Other biomechanical lesions of thoracic region: Secondary | ICD-10-CM | POA: Diagnosis not present

## 2015-10-15 DIAGNOSIS — S14105S Unspecified injury at C5 level of cervical spinal cord, sequela: Secondary | ICD-10-CM | POA: Diagnosis not present

## 2015-10-15 DIAGNOSIS — M961 Postlaminectomy syndrome, not elsewhere classified: Secondary | ICD-10-CM | POA: Diagnosis not present

## 2015-10-15 DIAGNOSIS — G890 Central pain syndrome: Secondary | ICD-10-CM | POA: Diagnosis not present

## 2015-10-15 DIAGNOSIS — Z5181 Encounter for therapeutic drug level monitoring: Secondary | ICD-10-CM | POA: Diagnosis not present

## 2015-10-15 DIAGNOSIS — Z79899 Other long term (current) drug therapy: Secondary | ICD-10-CM | POA: Diagnosis not present

## 2015-10-15 DIAGNOSIS — G894 Chronic pain syndrome: Secondary | ICD-10-CM | POA: Diagnosis not present

## 2015-10-28 DIAGNOSIS — E291 Testicular hypofunction: Secondary | ICD-10-CM | POA: Diagnosis not present

## 2015-11-04 DIAGNOSIS — Z6827 Body mass index (BMI) 27.0-27.9, adult: Secondary | ICD-10-CM | POA: Diagnosis not present

## 2015-11-04 DIAGNOSIS — E663 Overweight: Secondary | ICD-10-CM | POA: Diagnosis not present

## 2015-11-04 DIAGNOSIS — S46911A Strain of unspecified muscle, fascia and tendon at shoulder and upper arm level, right arm, initial encounter: Secondary | ICD-10-CM | POA: Diagnosis not present

## 2015-11-04 DIAGNOSIS — M25511 Pain in right shoulder: Secondary | ICD-10-CM | POA: Diagnosis not present

## 2015-11-11 DIAGNOSIS — E291 Testicular hypofunction: Secondary | ICD-10-CM | POA: Diagnosis not present

## 2015-11-12 DIAGNOSIS — M25611 Stiffness of right shoulder, not elsewhere classified: Secondary | ICD-10-CM | POA: Diagnosis not present

## 2015-11-12 DIAGNOSIS — M25511 Pain in right shoulder: Secondary | ICD-10-CM | POA: Diagnosis not present

## 2015-11-19 DIAGNOSIS — M25611 Stiffness of right shoulder, not elsewhere classified: Secondary | ICD-10-CM | POA: Diagnosis not present

## 2015-11-19 DIAGNOSIS — M25511 Pain in right shoulder: Secondary | ICD-10-CM | POA: Diagnosis not present

## 2015-11-25 DIAGNOSIS — E291 Testicular hypofunction: Secondary | ICD-10-CM | POA: Diagnosis not present

## 2015-12-09 DIAGNOSIS — Z6827 Body mass index (BMI) 27.0-27.9, adult: Secondary | ICD-10-CM | POA: Diagnosis not present

## 2015-12-09 DIAGNOSIS — F418 Other specified anxiety disorders: Secondary | ICD-10-CM | POA: Diagnosis not present

## 2015-12-09 DIAGNOSIS — J309 Allergic rhinitis, unspecified: Secondary | ICD-10-CM | POA: Diagnosis not present

## 2015-12-09 DIAGNOSIS — E291 Testicular hypofunction: Secondary | ICD-10-CM | POA: Diagnosis not present

## 2015-12-16 DIAGNOSIS — R030 Elevated blood-pressure reading, without diagnosis of hypertension: Secondary | ICD-10-CM | POA: Diagnosis not present

## 2015-12-16 DIAGNOSIS — S32009K Unspecified fracture of unspecified lumbar vertebra, subsequent encounter for fracture with nonunion: Secondary | ICD-10-CM | POA: Diagnosis not present

## 2015-12-16 DIAGNOSIS — Z6827 Body mass index (BMI) 27.0-27.9, adult: Secondary | ICD-10-CM | POA: Diagnosis not present

## 2015-12-16 DIAGNOSIS — M4714 Other spondylosis with myelopathy, thoracic region: Secondary | ICD-10-CM | POA: Diagnosis not present

## 2015-12-24 DIAGNOSIS — E291 Testicular hypofunction: Secondary | ICD-10-CM | POA: Diagnosis not present

## 2015-12-31 DIAGNOSIS — G890 Central pain syndrome: Secondary | ICD-10-CM | POA: Diagnosis not present

## 2015-12-31 DIAGNOSIS — S14105S Unspecified injury at C5 level of cervical spinal cord, sequela: Secondary | ICD-10-CM | POA: Diagnosis not present

## 2015-12-31 DIAGNOSIS — M961 Postlaminectomy syndrome, not elsewhere classified: Secondary | ICD-10-CM | POA: Diagnosis not present

## 2015-12-31 DIAGNOSIS — G894 Chronic pain syndrome: Secondary | ICD-10-CM | POA: Diagnosis not present

## 2016-01-06 DIAGNOSIS — E291 Testicular hypofunction: Secondary | ICD-10-CM | POA: Diagnosis not present

## 2016-01-20 DIAGNOSIS — I10 Essential (primary) hypertension: Secondary | ICD-10-CM | POA: Diagnosis not present

## 2016-01-20 DIAGNOSIS — Z125 Encounter for screening for malignant neoplasm of prostate: Secondary | ICD-10-CM | POA: Diagnosis not present

## 2016-01-20 DIAGNOSIS — Z6827 Body mass index (BMI) 27.0-27.9, adult: Secondary | ICD-10-CM | POA: Diagnosis not present

## 2016-01-20 DIAGNOSIS — F418 Other specified anxiety disorders: Secondary | ICD-10-CM | POA: Diagnosis not present

## 2016-01-20 DIAGNOSIS — E291 Testicular hypofunction: Secondary | ICD-10-CM | POA: Diagnosis not present

## 2016-01-20 DIAGNOSIS — Z9181 History of falling: Secondary | ICD-10-CM | POA: Diagnosis not present

## 2016-01-20 DIAGNOSIS — E785 Hyperlipidemia, unspecified: Secondary | ICD-10-CM | POA: Diagnosis not present

## 2016-01-20 DIAGNOSIS — Z1389 Encounter for screening for other disorder: Secondary | ICD-10-CM | POA: Diagnosis not present

## 2016-02-03 DIAGNOSIS — E291 Testicular hypofunction: Secondary | ICD-10-CM | POA: Diagnosis not present

## 2016-02-17 DIAGNOSIS — E291 Testicular hypofunction: Secondary | ICD-10-CM | POA: Diagnosis not present

## 2016-03-03 DIAGNOSIS — E291 Testicular hypofunction: Secondary | ICD-10-CM | POA: Diagnosis not present

## 2016-03-17 DIAGNOSIS — E291 Testicular hypofunction: Secondary | ICD-10-CM | POA: Diagnosis not present

## 2016-03-23 DIAGNOSIS — S14105S Unspecified injury at C5 level of cervical spinal cord, sequela: Secondary | ICD-10-CM | POA: Diagnosis not present

## 2016-03-23 DIAGNOSIS — Z5181 Encounter for therapeutic drug level monitoring: Secondary | ICD-10-CM | POA: Diagnosis not present

## 2016-03-23 DIAGNOSIS — M961 Postlaminectomy syndrome, not elsewhere classified: Secondary | ICD-10-CM | POA: Diagnosis not present

## 2016-03-23 DIAGNOSIS — G890 Central pain syndrome: Secondary | ICD-10-CM | POA: Diagnosis not present

## 2016-03-23 DIAGNOSIS — G894 Chronic pain syndrome: Secondary | ICD-10-CM | POA: Diagnosis not present

## 2016-03-23 DIAGNOSIS — Z79899 Other long term (current) drug therapy: Secondary | ICD-10-CM | POA: Diagnosis not present

## 2016-03-30 DIAGNOSIS — E291 Testicular hypofunction: Secondary | ICD-10-CM | POA: Diagnosis not present

## 2016-04-13 DIAGNOSIS — I10 Essential (primary) hypertension: Secondary | ICD-10-CM | POA: Diagnosis not present

## 2016-04-13 DIAGNOSIS — E291 Testicular hypofunction: Secondary | ICD-10-CM | POA: Diagnosis not present

## 2016-04-13 DIAGNOSIS — F418 Other specified anxiety disorders: Secondary | ICD-10-CM | POA: Diagnosis not present

## 2016-04-13 DIAGNOSIS — Z6829 Body mass index (BMI) 29.0-29.9, adult: Secondary | ICD-10-CM | POA: Diagnosis not present

## 2016-04-13 DIAGNOSIS — L409 Psoriasis, unspecified: Secondary | ICD-10-CM | POA: Diagnosis not present

## 2016-04-27 DIAGNOSIS — F418 Other specified anxiety disorders: Secondary | ICD-10-CM | POA: Diagnosis not present

## 2016-04-27 DIAGNOSIS — R7989 Other specified abnormal findings of blood chemistry: Secondary | ICD-10-CM | POA: Diagnosis not present

## 2016-04-27 DIAGNOSIS — Z6829 Body mass index (BMI) 29.0-29.9, adult: Secondary | ICD-10-CM | POA: Diagnosis not present

## 2016-04-27 DIAGNOSIS — J208 Acute bronchitis due to other specified organisms: Secondary | ICD-10-CM | POA: Diagnosis not present

## 2016-04-27 DIAGNOSIS — E785 Hyperlipidemia, unspecified: Secondary | ICD-10-CM | POA: Diagnosis not present

## 2016-04-27 DIAGNOSIS — I10 Essential (primary) hypertension: Secondary | ICD-10-CM | POA: Diagnosis not present

## 2016-05-11 DIAGNOSIS — K219 Gastro-esophageal reflux disease without esophagitis: Secondary | ICD-10-CM | POA: Diagnosis not present

## 2016-05-11 DIAGNOSIS — I10 Essential (primary) hypertension: Secondary | ICD-10-CM | POA: Diagnosis not present

## 2016-05-11 DIAGNOSIS — R7989 Other specified abnormal findings of blood chemistry: Secondary | ICD-10-CM | POA: Diagnosis not present

## 2016-05-27 DIAGNOSIS — M9982 Other biomechanical lesions of thoracic region: Secondary | ICD-10-CM | POA: Diagnosis not present

## 2016-05-27 DIAGNOSIS — R7989 Other specified abnormal findings of blood chemistry: Secondary | ICD-10-CM | POA: Diagnosis not present

## 2016-05-27 DIAGNOSIS — M4727 Other spondylosis with radiculopathy, lumbosacral region: Secondary | ICD-10-CM | POA: Diagnosis not present

## 2016-05-27 DIAGNOSIS — S14129A Central cord syndrome at unspecified level of cervical spinal cord, initial encounter: Secondary | ICD-10-CM | POA: Diagnosis not present

## 2016-05-27 DIAGNOSIS — M48061 Spinal stenosis, lumbar region without neurogenic claudication: Secondary | ICD-10-CM | POA: Diagnosis not present

## 2016-05-27 DIAGNOSIS — Z23 Encounter for immunization: Secondary | ICD-10-CM | POA: Diagnosis not present

## 2016-05-27 DIAGNOSIS — Z6829 Body mass index (BMI) 29.0-29.9, adult: Secondary | ICD-10-CM | POA: Diagnosis not present

## 2016-05-27 DIAGNOSIS — M4712 Other spondylosis with myelopathy, cervical region: Secondary | ICD-10-CM | POA: Diagnosis not present

## 2016-06-07 DIAGNOSIS — I10 Essential (primary) hypertension: Secondary | ICD-10-CM | POA: Diagnosis not present

## 2016-06-07 DIAGNOSIS — M4712 Other spondylosis with myelopathy, cervical region: Secondary | ICD-10-CM | POA: Diagnosis not present

## 2016-06-07 DIAGNOSIS — Z6829 Body mass index (BMI) 29.0-29.9, adult: Secondary | ICD-10-CM | POA: Diagnosis not present

## 2016-06-07 DIAGNOSIS — M48061 Spinal stenosis, lumbar region without neurogenic claudication: Secondary | ICD-10-CM | POA: Diagnosis not present

## 2016-06-07 DIAGNOSIS — M9982 Other biomechanical lesions of thoracic region: Secondary | ICD-10-CM | POA: Diagnosis not present

## 2016-06-07 DIAGNOSIS — M4727 Other spondylosis with radiculopathy, lumbosacral region: Secondary | ICD-10-CM | POA: Diagnosis not present

## 2016-06-07 DIAGNOSIS — S14129A Central cord syndrome at unspecified level of cervical spinal cord, initial encounter: Secondary | ICD-10-CM | POA: Diagnosis not present

## 2016-06-14 DIAGNOSIS — Z125 Encounter for screening for malignant neoplasm of prostate: Secondary | ICD-10-CM | POA: Diagnosis not present

## 2016-06-14 DIAGNOSIS — Z1389 Encounter for screening for other disorder: Secondary | ICD-10-CM | POA: Diagnosis not present

## 2016-06-14 DIAGNOSIS — Z9181 History of falling: Secondary | ICD-10-CM | POA: Diagnosis not present

## 2016-06-14 DIAGNOSIS — Z136 Encounter for screening for cardiovascular disorders: Secondary | ICD-10-CM | POA: Diagnosis not present

## 2016-06-14 DIAGNOSIS — Z0000 Encounter for general adult medical examination without abnormal findings: Secondary | ICD-10-CM | POA: Diagnosis not present

## 2016-06-15 DIAGNOSIS — M961 Postlaminectomy syndrome, not elsewhere classified: Secondary | ICD-10-CM | POA: Diagnosis not present

## 2016-06-15 DIAGNOSIS — G890 Central pain syndrome: Secondary | ICD-10-CM | POA: Diagnosis not present

## 2016-06-15 DIAGNOSIS — S14105S Unspecified injury at C5 level of cervical spinal cord, sequela: Secondary | ICD-10-CM | POA: Diagnosis not present

## 2016-06-15 DIAGNOSIS — E291 Testicular hypofunction: Secondary | ICD-10-CM | POA: Diagnosis not present

## 2016-06-15 DIAGNOSIS — G894 Chronic pain syndrome: Secondary | ICD-10-CM | POA: Diagnosis not present

## 2016-06-29 DIAGNOSIS — E291 Testicular hypofunction: Secondary | ICD-10-CM | POA: Diagnosis not present

## 2016-07-08 IMAGING — MR MR CERVICAL SPINE W/O CM
5 series · 28 of 48 positions shown · non-contrast
Comparison: Radiography 02/28/2014

CLINICAL DATA: Personal history of cervical spine injury in 0885
with fusion in 8991. Bilateral hand pain and numbness.

EXAM:
MRI CERVICAL SPINE WITHOUT CONTRAST
TECHNIQUE: Multiplanar, multisequence MR imaging of the cervical spine was
performed. No intravenous contrast was administered.

[Series 3: T2 · sagittal · 3.0mm · 0.66mm/px · 6 of 12 slices shown (1 of 2)]
[im 1/12]
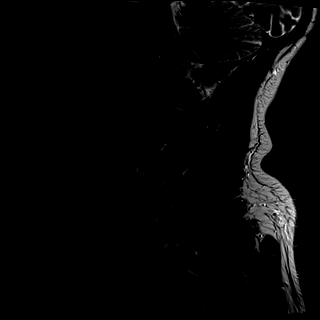
[im 3/12]
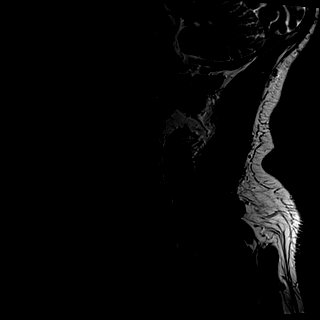
[im 5/12]
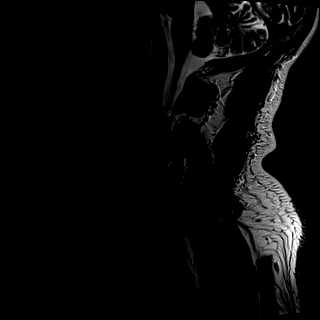
[im 7/12]
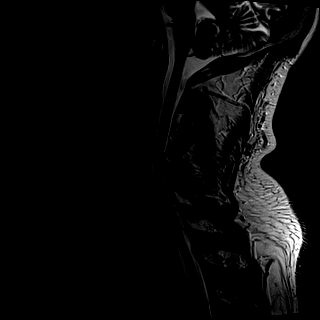
[im 9/12]
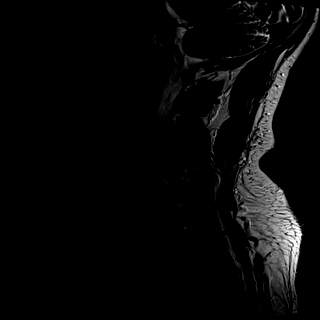
[im 12/12]
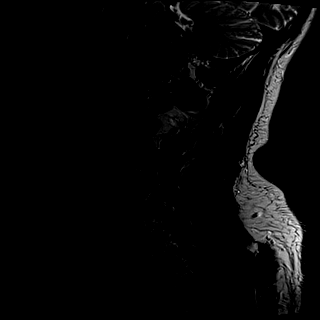

[Series 4: T1 · sagittal · 3.0mm · 0.41mm/px · 6 of 12 slices shown]
[im 1/12]
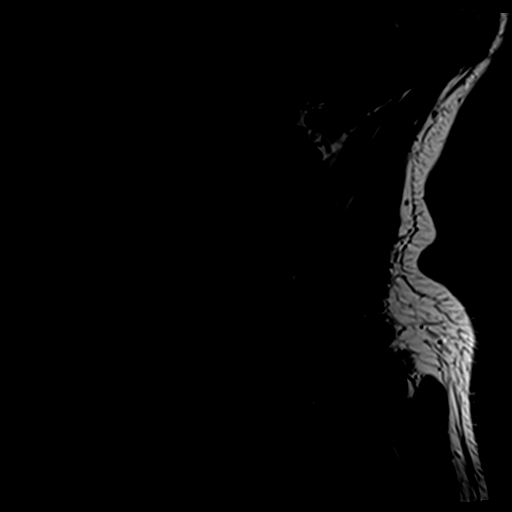
[im 3/12]
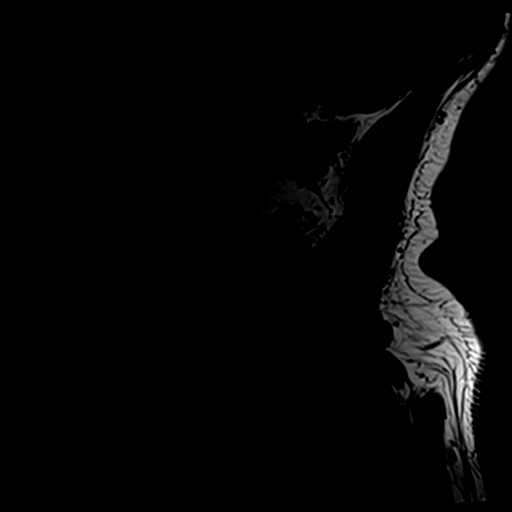
[im 5/12]
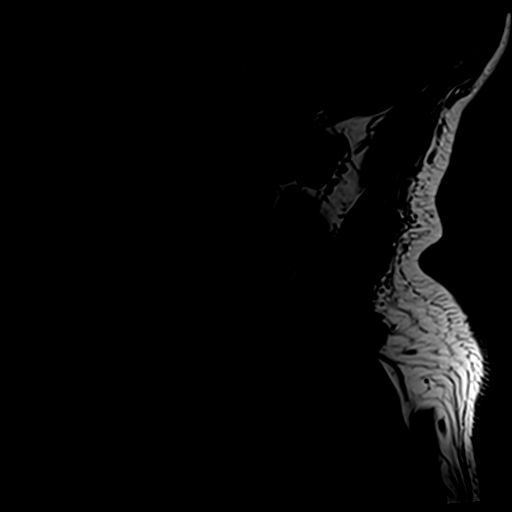
[im 7/12]
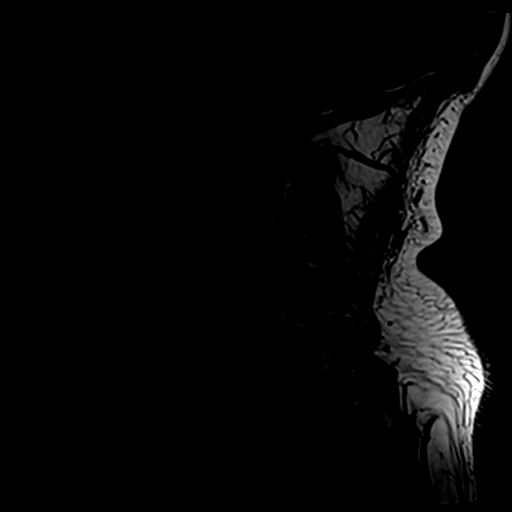
[im 9/12]
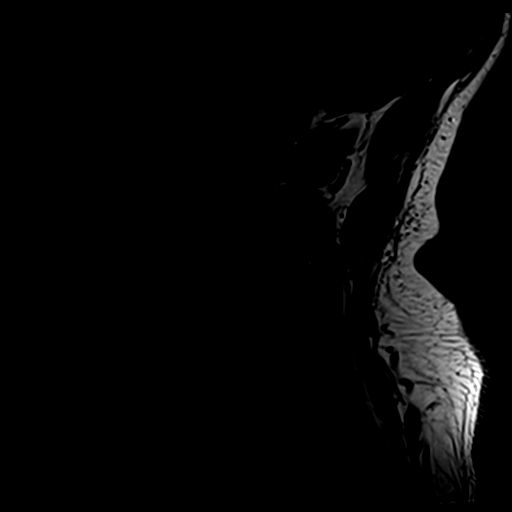
[im 12/12]
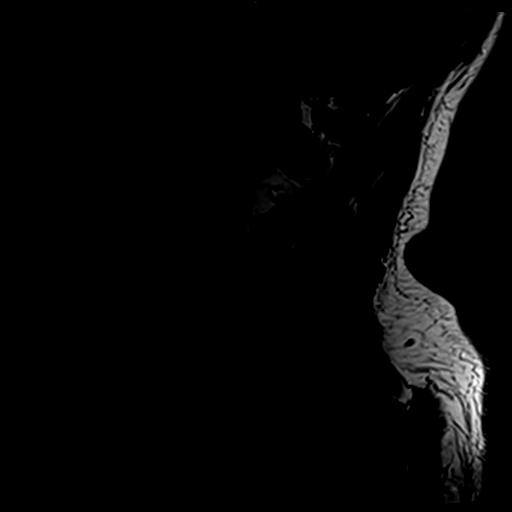

[Series 5: tir sag · sagittal · 3.0mm · 0.41mm/px · 6 of 12 slices shown]
[im 1/12]
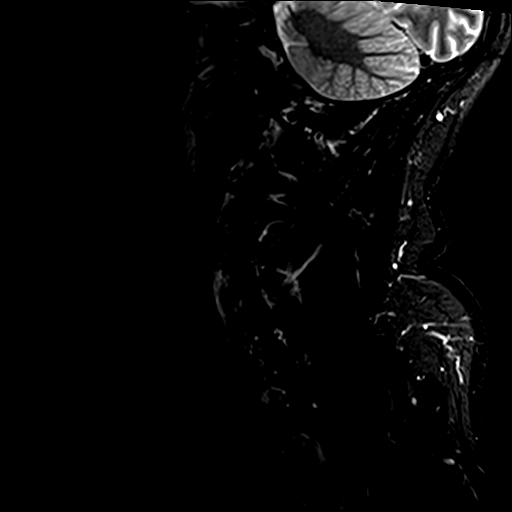
[im 3/12]
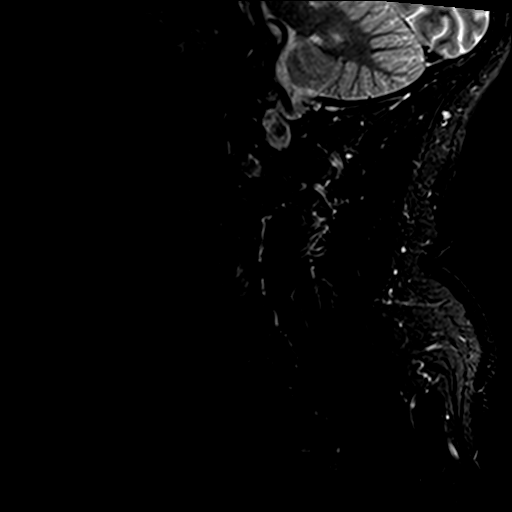
[im 5/12]
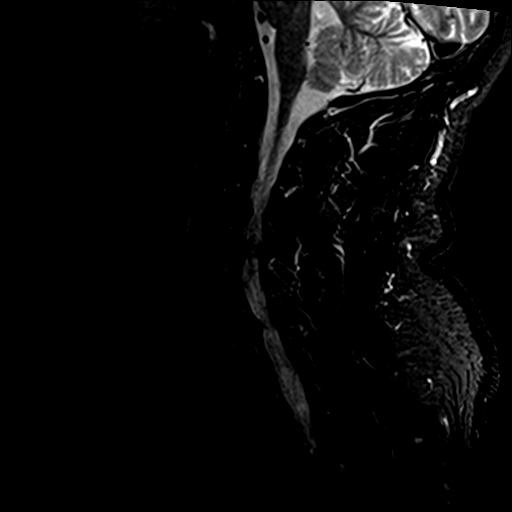
[im 7/12]
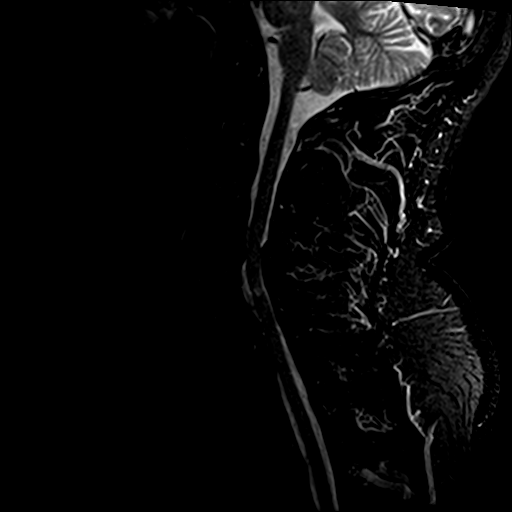
[im 9/12]
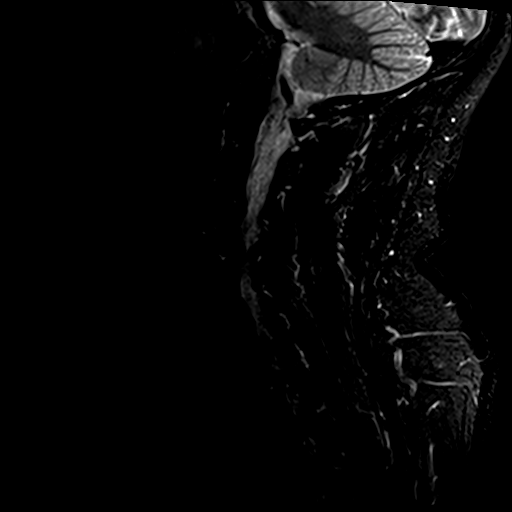
[im 12/12]
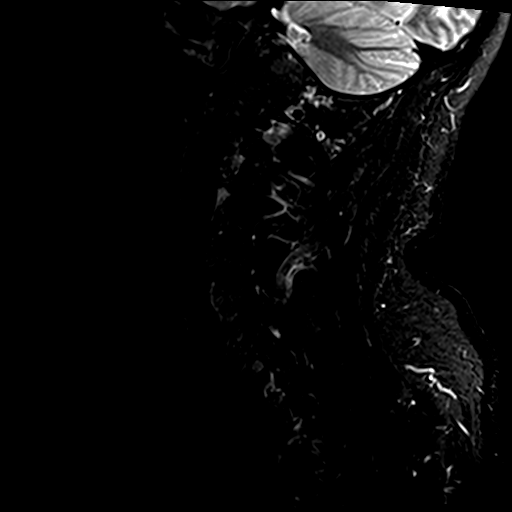

[Series 6: GRE · axial · 3.0mm · 0.35mm/px · 1 of 32 slices shown]
[im 1/32]
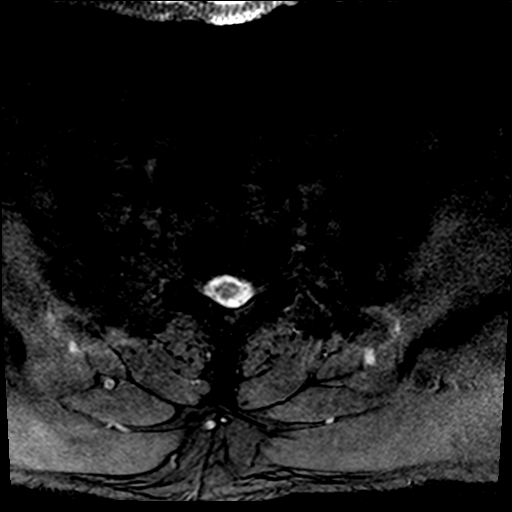

[Series 7: T2 · axial · 3.0mm · 0.70mm/px · z∈[-51,+65]mm · 9 of 32 slices shown (2 of 2)]
[im 1/32]
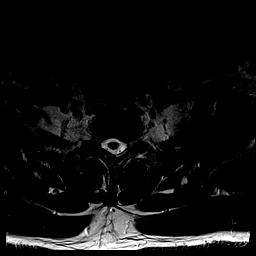
[im 5/32]
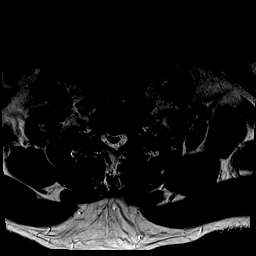
[im 9/32]
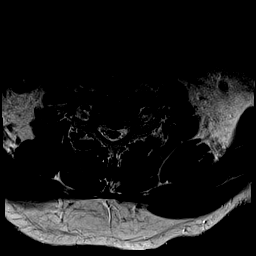
[im 14/32]
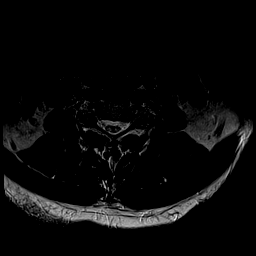
[im 16/32]
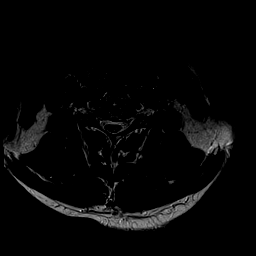
[im 18/32]
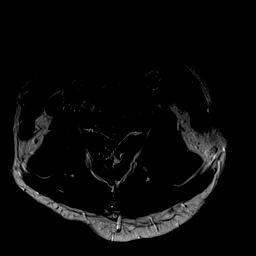
[im 23/32]
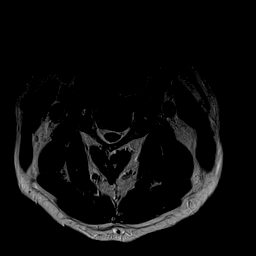
[im 27/32]
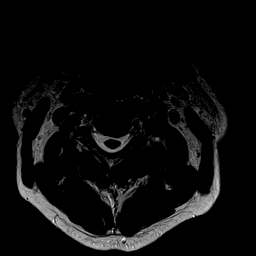
[im 32/32]
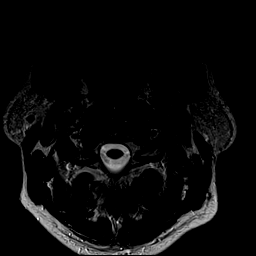

[28 of 48 positions shown; findings below may reference images not displayed]

FINDINGS: The foramen magnum is widely patent.  C1-2 is normal.

C2-3:  Mild bulging of the disc.  No canal or foraminal stenosis.

C3-4: Spondylosis with endplate osteophytes and bulging of the disc.
Narrowing of the ventral subarachnoid space but no compression of
the cord. Mild foraminal narrowing because of osteophytes without
gross neural compression.

C4-5: Spondylosis with endplate osteophytes and bulging of the disc.
Effacement of the subarachnoid space surrounding the cord. AP
diameter of the canal only 6.5 mm. No cord compression or edema.
Foraminal stenosis bilaterally because of osteophytic encroachment.

C5-6: Distant anterior cervical fusion. Wide patency of the canal
with ample subarachnoid space surrounding the cord. Cord atrophy and
gliosis at this level.

C6-7: Spondylosis with endplate osteophytes and shallow protrusion
of disc material. Effacement of the subarachnoid space surrounding
the cord. No cord compression or edema. AP diameter of the canal
measures 6.5 mm. There is mild to moderate foraminal stenosis
bilaterally.

C7-T1: Shallow central disc herniation effaces the ventral
subarachnoid space but does not compress the cord. There is mild
facet degeneration bilaterally. Mild foraminal narrowing without
gross compression of the exiting C8 nerve roots.
IMPRESSION: Distant fusion at C5-6. Fusion appears solid. Wide patency of the
canal. Atrophy and gliosis of the cord at this level, consistent
with the given history of old cervical spine trauma.

Adjacent segment spondylosis at C4-5. AP diameter of the canal only
6.5 mm. Effacement of the subarachnoid space. No cord edema at this
moment. Foraminal stenosis bilaterally could affect the C5 nerve
roots.

Adjacent segment spondylosis at C6-7. AP diameter of the canal only
6.5 mm. Effacement of the subarachnoid space. No cord edema. Mild to
moderate foraminal narrowing bilaterally.

Shallow central disc herniation at C7-T1 without gross neural
compression.

## 2016-07-14 DIAGNOSIS — R7989 Other specified abnormal findings of blood chemistry: Secondary | ICD-10-CM | POA: Diagnosis not present

## 2016-08-03 DIAGNOSIS — L409 Psoriasis, unspecified: Secondary | ICD-10-CM | POA: Diagnosis not present

## 2016-08-03 DIAGNOSIS — E785 Hyperlipidemia, unspecified: Secondary | ICD-10-CM | POA: Diagnosis not present

## 2016-08-03 DIAGNOSIS — F418 Other specified anxiety disorders: Secondary | ICD-10-CM | POA: Diagnosis not present

## 2016-08-03 DIAGNOSIS — I10 Essential (primary) hypertension: Secondary | ICD-10-CM | POA: Diagnosis not present

## 2016-08-03 DIAGNOSIS — E291 Testicular hypofunction: Secondary | ICD-10-CM | POA: Diagnosis not present

## 2016-08-03 DIAGNOSIS — Z6829 Body mass index (BMI) 29.0-29.9, adult: Secondary | ICD-10-CM | POA: Diagnosis not present

## 2016-08-17 DIAGNOSIS — E291 Testicular hypofunction: Secondary | ICD-10-CM | POA: Diagnosis not present

## 2016-09-01 DIAGNOSIS — E291 Testicular hypofunction: Secondary | ICD-10-CM | POA: Diagnosis not present

## 2016-09-12 DIAGNOSIS — G894 Chronic pain syndrome: Secondary | ICD-10-CM | POA: Diagnosis not present

## 2016-09-12 DIAGNOSIS — M961 Postlaminectomy syndrome, not elsewhere classified: Secondary | ICD-10-CM | POA: Diagnosis not present

## 2016-09-12 DIAGNOSIS — S14105S Unspecified injury at C5 level of cervical spinal cord, sequela: Secondary | ICD-10-CM | POA: Diagnosis not present

## 2016-09-12 DIAGNOSIS — Z79899 Other long term (current) drug therapy: Secondary | ICD-10-CM | POA: Diagnosis not present

## 2016-09-12 DIAGNOSIS — G890 Central pain syndrome: Secondary | ICD-10-CM | POA: Diagnosis not present

## 2016-09-12 DIAGNOSIS — Z5181 Encounter for therapeutic drug level monitoring: Secondary | ICD-10-CM | POA: Diagnosis not present

## 2016-09-14 DIAGNOSIS — E291 Testicular hypofunction: Secondary | ICD-10-CM | POA: Diagnosis not present

## 2016-09-28 DIAGNOSIS — E291 Testicular hypofunction: Secondary | ICD-10-CM | POA: Diagnosis not present

## 2016-10-12 DIAGNOSIS — J208 Acute bronchitis due to other specified organisms: Secondary | ICD-10-CM | POA: Diagnosis not present

## 2016-10-12 DIAGNOSIS — Z6830 Body mass index (BMI) 30.0-30.9, adult: Secondary | ICD-10-CM | POA: Diagnosis not present

## 2016-10-12 DIAGNOSIS — F419 Anxiety disorder, unspecified: Secondary | ICD-10-CM | POA: Diagnosis not present

## 2016-10-12 DIAGNOSIS — E291 Testicular hypofunction: Secondary | ICD-10-CM | POA: Diagnosis not present

## 2016-10-26 DIAGNOSIS — E291 Testicular hypofunction: Secondary | ICD-10-CM | POA: Diagnosis not present

## 2016-11-07 DIAGNOSIS — G890 Central pain syndrome: Secondary | ICD-10-CM | POA: Diagnosis not present

## 2016-11-07 DIAGNOSIS — G894 Chronic pain syndrome: Secondary | ICD-10-CM | POA: Diagnosis not present

## 2016-11-08 DIAGNOSIS — E785 Hyperlipidemia, unspecified: Secondary | ICD-10-CM | POA: Diagnosis not present

## 2016-11-08 DIAGNOSIS — Z23 Encounter for immunization: Secondary | ICD-10-CM | POA: Diagnosis not present

## 2016-11-08 DIAGNOSIS — F419 Anxiety disorder, unspecified: Secondary | ICD-10-CM | POA: Diagnosis not present

## 2016-11-08 DIAGNOSIS — I10 Essential (primary) hypertension: Secondary | ICD-10-CM | POA: Diagnosis not present

## 2016-11-08 DIAGNOSIS — Z6829 Body mass index (BMI) 29.0-29.9, adult: Secondary | ICD-10-CM | POA: Diagnosis not present

## 2016-11-08 DIAGNOSIS — E291 Testicular hypofunction: Secondary | ICD-10-CM | POA: Diagnosis not present

## 2016-11-22 DIAGNOSIS — E291 Testicular hypofunction: Secondary | ICD-10-CM | POA: Diagnosis not present

## 2016-12-06 DIAGNOSIS — R0609 Other forms of dyspnea: Secondary | ICD-10-CM | POA: Diagnosis not present

## 2016-12-06 DIAGNOSIS — R252 Cramp and spasm: Secondary | ICD-10-CM | POA: Diagnosis not present

## 2016-12-06 DIAGNOSIS — Z6829 Body mass index (BMI) 29.0-29.9, adult: Secondary | ICD-10-CM | POA: Diagnosis not present

## 2016-12-06 DIAGNOSIS — E291 Testicular hypofunction: Secondary | ICD-10-CM | POA: Diagnosis not present

## 2016-12-07 DIAGNOSIS — I700 Atherosclerosis of aorta: Secondary | ICD-10-CM | POA: Diagnosis not present

## 2016-12-07 DIAGNOSIS — I517 Cardiomegaly: Secondary | ICD-10-CM | POA: Diagnosis not present

## 2016-12-07 DIAGNOSIS — R0609 Other forms of dyspnea: Secondary | ICD-10-CM | POA: Diagnosis not present

## 2016-12-07 DIAGNOSIS — R079 Chest pain, unspecified: Secondary | ICD-10-CM | POA: Diagnosis not present

## 2016-12-13 DIAGNOSIS — I517 Cardiomegaly: Secondary | ICD-10-CM | POA: Diagnosis not present

## 2016-12-20 DIAGNOSIS — I517 Cardiomegaly: Secondary | ICD-10-CM | POA: Diagnosis not present

## 2016-12-20 DIAGNOSIS — I10 Essential (primary) hypertension: Secondary | ICD-10-CM | POA: Diagnosis not present

## 2016-12-20 DIAGNOSIS — Z6830 Body mass index (BMI) 30.0-30.9, adult: Secondary | ICD-10-CM | POA: Diagnosis not present

## 2016-12-20 DIAGNOSIS — E291 Testicular hypofunction: Secondary | ICD-10-CM | POA: Diagnosis not present

## 2016-12-20 DIAGNOSIS — F419 Anxiety disorder, unspecified: Secondary | ICD-10-CM | POA: Diagnosis not present

## 2016-12-20 DIAGNOSIS — R0600 Dyspnea, unspecified: Secondary | ICD-10-CM | POA: Diagnosis not present

## 2017-01-03 DIAGNOSIS — E291 Testicular hypofunction: Secondary | ICD-10-CM | POA: Diagnosis not present

## 2017-01-17 DIAGNOSIS — I10 Essential (primary) hypertension: Secondary | ICD-10-CM | POA: Diagnosis not present

## 2017-01-17 DIAGNOSIS — Z6829 Body mass index (BMI) 29.0-29.9, adult: Secondary | ICD-10-CM | POA: Diagnosis not present

## 2017-01-17 DIAGNOSIS — E291 Testicular hypofunction: Secondary | ICD-10-CM | POA: Diagnosis not present

## 2017-01-17 DIAGNOSIS — F419 Anxiety disorder, unspecified: Secondary | ICD-10-CM | POA: Diagnosis not present

## 2017-01-30 DIAGNOSIS — G894 Chronic pain syndrome: Secondary | ICD-10-CM | POA: Diagnosis not present

## 2017-01-30 DIAGNOSIS — G890 Central pain syndrome: Secondary | ICD-10-CM | POA: Diagnosis not present

## 2017-01-30 DIAGNOSIS — S14105S Unspecified injury at C5 level of cervical spinal cord, sequela: Secondary | ICD-10-CM | POA: Diagnosis not present

## 2017-01-30 DIAGNOSIS — M961 Postlaminectomy syndrome, not elsewhere classified: Secondary | ICD-10-CM | POA: Diagnosis not present

## 2017-01-31 DIAGNOSIS — E291 Testicular hypofunction: Secondary | ICD-10-CM | POA: Diagnosis not present

## 2017-02-14 DIAGNOSIS — M25511 Pain in right shoulder: Secondary | ICD-10-CM | POA: Diagnosis not present

## 2017-02-14 DIAGNOSIS — L400 Psoriasis vulgaris: Secondary | ICD-10-CM | POA: Diagnosis not present

## 2017-02-14 DIAGNOSIS — I10 Essential (primary) hypertension: Secondary | ICD-10-CM | POA: Diagnosis not present

## 2017-02-14 DIAGNOSIS — E785 Hyperlipidemia, unspecified: Secondary | ICD-10-CM | POA: Diagnosis not present

## 2017-02-14 DIAGNOSIS — E291 Testicular hypofunction: Secondary | ICD-10-CM | POA: Diagnosis not present

## 2017-02-14 DIAGNOSIS — F419 Anxiety disorder, unspecified: Secondary | ICD-10-CM | POA: Diagnosis not present

## 2017-02-14 DIAGNOSIS — Z125 Encounter for screening for malignant neoplasm of prostate: Secondary | ICD-10-CM | POA: Diagnosis not present

## 2017-02-14 DIAGNOSIS — Z9181 History of falling: Secondary | ICD-10-CM | POA: Diagnosis not present

## 2017-02-14 DIAGNOSIS — Z6828 Body mass index (BMI) 28.0-28.9, adult: Secondary | ICD-10-CM | POA: Diagnosis not present

## 2017-02-15 DIAGNOSIS — M25511 Pain in right shoulder: Secondary | ICD-10-CM | POA: Diagnosis not present

## 2017-02-22 DIAGNOSIS — M25511 Pain in right shoulder: Secondary | ICD-10-CM | POA: Diagnosis not present

## 2017-02-22 DIAGNOSIS — R937 Abnormal findings on diagnostic imaging of other parts of musculoskeletal system: Secondary | ICD-10-CM | POA: Diagnosis not present

## 2017-02-28 DIAGNOSIS — M75121 Complete rotator cuff tear or rupture of right shoulder, not specified as traumatic: Secondary | ICD-10-CM | POA: Diagnosis not present

## 2017-02-28 DIAGNOSIS — Z6828 Body mass index (BMI) 28.0-28.9, adult: Secondary | ICD-10-CM | POA: Diagnosis not present

## 2017-02-28 DIAGNOSIS — R21 Rash and other nonspecific skin eruption: Secondary | ICD-10-CM | POA: Diagnosis not present

## 2017-02-28 DIAGNOSIS — M25511 Pain in right shoulder: Secondary | ICD-10-CM | POA: Diagnosis not present

## 2017-02-28 DIAGNOSIS — E291 Testicular hypofunction: Secondary | ICD-10-CM | POA: Diagnosis not present

## 2017-03-06 DIAGNOSIS — M25611 Stiffness of right shoulder, not elsewhere classified: Secondary | ICD-10-CM | POA: Diagnosis not present

## 2017-03-06 DIAGNOSIS — M6281 Muscle weakness (generalized): Secondary | ICD-10-CM | POA: Diagnosis not present

## 2017-03-06 DIAGNOSIS — M25511 Pain in right shoulder: Secondary | ICD-10-CM | POA: Diagnosis not present

## 2017-03-14 DIAGNOSIS — Z23 Encounter for immunization: Secondary | ICD-10-CM | POA: Diagnosis not present

## 2017-03-14 DIAGNOSIS — E291 Testicular hypofunction: Secondary | ICD-10-CM | POA: Diagnosis not present

## 2017-03-14 DIAGNOSIS — F5104 Psychophysiologic insomnia: Secondary | ICD-10-CM | POA: Diagnosis not present

## 2017-03-14 DIAGNOSIS — Z6829 Body mass index (BMI) 29.0-29.9, adult: Secondary | ICD-10-CM | POA: Diagnosis not present

## 2017-03-14 DIAGNOSIS — F419 Anxiety disorder, unspecified: Secondary | ICD-10-CM | POA: Diagnosis not present

## 2017-03-28 DIAGNOSIS — E291 Testicular hypofunction: Secondary | ICD-10-CM | POA: Diagnosis not present

## 2017-04-05 DIAGNOSIS — R29898 Other symptoms and signs involving the musculoskeletal system: Secondary | ICD-10-CM | POA: Diagnosis not present

## 2017-04-05 DIAGNOSIS — Z5181 Encounter for therapeutic drug level monitoring: Secondary | ICD-10-CM | POA: Diagnosis not present

## 2017-04-05 DIAGNOSIS — Z79899 Other long term (current) drug therapy: Secondary | ICD-10-CM | POA: Diagnosis not present

## 2017-04-05 DIAGNOSIS — G890 Central pain syndrome: Secondary | ICD-10-CM | POA: Diagnosis not present

## 2017-04-05 DIAGNOSIS — M961 Postlaminectomy syndrome, not elsewhere classified: Secondary | ICD-10-CM | POA: Diagnosis not present

## 2017-04-05 DIAGNOSIS — G894 Chronic pain syndrome: Secondary | ICD-10-CM | POA: Diagnosis not present

## 2017-04-11 DIAGNOSIS — E291 Testicular hypofunction: Secondary | ICD-10-CM | POA: Diagnosis not present

## 2017-04-25 DIAGNOSIS — E291 Testicular hypofunction: Secondary | ICD-10-CM | POA: Diagnosis not present

## 2017-05-11 DIAGNOSIS — E291 Testicular hypofunction: Secondary | ICD-10-CM | POA: Diagnosis not present

## 2017-05-24 DIAGNOSIS — E785 Hyperlipidemia, unspecified: Secondary | ICD-10-CM | POA: Diagnosis not present

## 2017-05-24 DIAGNOSIS — E291 Testicular hypofunction: Secondary | ICD-10-CM | POA: Diagnosis not present

## 2017-05-25 DIAGNOSIS — E785 Hyperlipidemia, unspecified: Secondary | ICD-10-CM | POA: Diagnosis not present

## 2017-05-25 DIAGNOSIS — Z6827 Body mass index (BMI) 27.0-27.9, adult: Secondary | ICD-10-CM | POA: Diagnosis not present

## 2017-05-25 DIAGNOSIS — E291 Testicular hypofunction: Secondary | ICD-10-CM | POA: Diagnosis not present

## 2017-05-25 DIAGNOSIS — F419 Anxiety disorder, unspecified: Secondary | ICD-10-CM | POA: Diagnosis not present

## 2017-05-25 DIAGNOSIS — I10 Essential (primary) hypertension: Secondary | ICD-10-CM | POA: Diagnosis not present

## 2017-06-06 DIAGNOSIS — G890 Central pain syndrome: Secondary | ICD-10-CM | POA: Diagnosis not present

## 2017-06-06 DIAGNOSIS — M961 Postlaminectomy syndrome, not elsewhere classified: Secondary | ICD-10-CM | POA: Diagnosis not present

## 2017-06-06 DIAGNOSIS — R29898 Other symptoms and signs involving the musculoskeletal system: Secondary | ICD-10-CM | POA: Diagnosis not present

## 2017-06-06 DIAGNOSIS — Z79899 Other long term (current) drug therapy: Secondary | ICD-10-CM | POA: Diagnosis not present

## 2017-06-06 DIAGNOSIS — G894 Chronic pain syndrome: Secondary | ICD-10-CM | POA: Diagnosis not present

## 2017-06-06 DIAGNOSIS — Z5181 Encounter for therapeutic drug level monitoring: Secondary | ICD-10-CM | POA: Diagnosis not present

## 2017-06-08 DIAGNOSIS — E291 Testicular hypofunction: Secondary | ICD-10-CM | POA: Diagnosis not present

## 2017-06-21 DIAGNOSIS — E291 Testicular hypofunction: Secondary | ICD-10-CM | POA: Diagnosis not present

## 2017-06-21 DIAGNOSIS — L409 Psoriasis, unspecified: Secondary | ICD-10-CM | POA: Diagnosis not present

## 2017-07-05 DIAGNOSIS — Z136 Encounter for screening for cardiovascular disorders: Secondary | ICD-10-CM | POA: Diagnosis not present

## 2017-07-05 DIAGNOSIS — Z9181 History of falling: Secondary | ICD-10-CM | POA: Diagnosis not present

## 2017-07-05 DIAGNOSIS — E291 Testicular hypofunction: Secondary | ICD-10-CM | POA: Diagnosis not present

## 2017-07-05 DIAGNOSIS — E785 Hyperlipidemia, unspecified: Secondary | ICD-10-CM | POA: Diagnosis not present

## 2017-07-05 DIAGNOSIS — Z1331 Encounter for screening for depression: Secondary | ICD-10-CM | POA: Diagnosis not present

## 2017-07-05 DIAGNOSIS — Z0000 Encounter for general adult medical examination without abnormal findings: Secondary | ICD-10-CM | POA: Diagnosis not present

## 2017-07-05 DIAGNOSIS — Z125 Encounter for screening for malignant neoplasm of prostate: Secondary | ICD-10-CM | POA: Diagnosis not present

## 2017-07-19 DIAGNOSIS — J069 Acute upper respiratory infection, unspecified: Secondary | ICD-10-CM | POA: Diagnosis not present

## 2017-07-19 DIAGNOSIS — H9201 Otalgia, right ear: Secondary | ICD-10-CM | POA: Diagnosis not present

## 2017-07-19 DIAGNOSIS — E291 Testicular hypofunction: Secondary | ICD-10-CM | POA: Diagnosis not present

## 2017-07-19 DIAGNOSIS — Z6828 Body mass index (BMI) 28.0-28.9, adult: Secondary | ICD-10-CM | POA: Diagnosis not present

## 2017-07-19 DIAGNOSIS — J029 Acute pharyngitis, unspecified: Secondary | ICD-10-CM | POA: Diagnosis not present

## 2017-08-02 DIAGNOSIS — E291 Testicular hypofunction: Secondary | ICD-10-CM | POA: Diagnosis not present

## 2017-08-03 DIAGNOSIS — M961 Postlaminectomy syndrome, not elsewhere classified: Secondary | ICD-10-CM | POA: Diagnosis not present

## 2017-08-03 DIAGNOSIS — R29898 Other symptoms and signs involving the musculoskeletal system: Secondary | ICD-10-CM | POA: Diagnosis not present

## 2017-08-03 DIAGNOSIS — G894 Chronic pain syndrome: Secondary | ICD-10-CM | POA: Diagnosis not present

## 2017-08-03 DIAGNOSIS — G890 Central pain syndrome: Secondary | ICD-10-CM | POA: Diagnosis not present

## 2017-08-16 DIAGNOSIS — E291 Testicular hypofunction: Secondary | ICD-10-CM | POA: Diagnosis not present

## 2017-08-16 DIAGNOSIS — R0602 Shortness of breath: Secondary | ICD-10-CM | POA: Diagnosis not present

## 2017-08-23 DIAGNOSIS — Z6829 Body mass index (BMI) 29.0-29.9, adult: Secondary | ICD-10-CM | POA: Diagnosis not present

## 2017-08-23 DIAGNOSIS — E291 Testicular hypofunction: Secondary | ICD-10-CM | POA: Diagnosis not present

## 2017-08-23 DIAGNOSIS — I10 Essential (primary) hypertension: Secondary | ICD-10-CM | POA: Diagnosis not present

## 2017-08-23 DIAGNOSIS — E785 Hyperlipidemia, unspecified: Secondary | ICD-10-CM | POA: Diagnosis not present

## 2017-08-23 DIAGNOSIS — F419 Anxiety disorder, unspecified: Secondary | ICD-10-CM | POA: Diagnosis not present

## 2017-08-23 DIAGNOSIS — F410 Panic disorder [episodic paroxysmal anxiety] without agoraphobia: Secondary | ICD-10-CM | POA: Diagnosis not present

## 2017-08-30 DIAGNOSIS — E291 Testicular hypofunction: Secondary | ICD-10-CM | POA: Diagnosis not present

## 2017-08-30 DIAGNOSIS — E785 Hyperlipidemia, unspecified: Secondary | ICD-10-CM | POA: Diagnosis not present

## 2017-09-04 DIAGNOSIS — F410 Panic disorder [episodic paroxysmal anxiety] without agoraphobia: Secondary | ICD-10-CM | POA: Diagnosis not present

## 2017-09-13 DIAGNOSIS — E291 Testicular hypofunction: Secondary | ICD-10-CM | POA: Diagnosis not present

## 2017-09-21 DIAGNOSIS — L853 Xerosis cutis: Secondary | ICD-10-CM | POA: Diagnosis not present

## 2017-09-21 DIAGNOSIS — L219 Seborrheic dermatitis, unspecified: Secondary | ICD-10-CM | POA: Diagnosis not present

## 2017-09-21 DIAGNOSIS — L299 Pruritus, unspecified: Secondary | ICD-10-CM | POA: Diagnosis not present

## 2017-09-21 DIAGNOSIS — L719 Rosacea, unspecified: Secondary | ICD-10-CM | POA: Diagnosis not present

## 2017-10-13 DIAGNOSIS — E291 Testicular hypofunction: Secondary | ICD-10-CM | POA: Diagnosis not present

## 2017-10-18 DIAGNOSIS — G890 Central pain syndrome: Secondary | ICD-10-CM | POA: Diagnosis not present

## 2017-10-18 DIAGNOSIS — G894 Chronic pain syndrome: Secondary | ICD-10-CM | POA: Diagnosis not present

## 2017-10-18 DIAGNOSIS — R29898 Other symptoms and signs involving the musculoskeletal system: Secondary | ICD-10-CM | POA: Diagnosis not present

## 2017-10-18 DIAGNOSIS — M961 Postlaminectomy syndrome, not elsewhere classified: Secondary | ICD-10-CM | POA: Diagnosis not present

## 2017-10-25 DIAGNOSIS — E291 Testicular hypofunction: Secondary | ICD-10-CM | POA: Diagnosis not present

## 2017-11-08 DIAGNOSIS — E291 Testicular hypofunction: Secondary | ICD-10-CM | POA: Diagnosis not present

## 2017-11-23 DIAGNOSIS — E291 Testicular hypofunction: Secondary | ICD-10-CM | POA: Diagnosis not present

## 2017-12-06 DIAGNOSIS — F419 Anxiety disorder, unspecified: Secondary | ICD-10-CM | POA: Diagnosis not present

## 2017-12-06 DIAGNOSIS — G4700 Insomnia, unspecified: Secondary | ICD-10-CM | POA: Diagnosis not present

## 2017-12-06 DIAGNOSIS — E291 Testicular hypofunction: Secondary | ICD-10-CM | POA: Diagnosis not present

## 2017-12-20 DIAGNOSIS — G890 Central pain syndrome: Secondary | ICD-10-CM | POA: Diagnosis not present

## 2017-12-20 DIAGNOSIS — Z79899 Other long term (current) drug therapy: Secondary | ICD-10-CM | POA: Diagnosis not present

## 2017-12-20 DIAGNOSIS — Z5181 Encounter for therapeutic drug level monitoring: Secondary | ICD-10-CM | POA: Diagnosis not present

## 2017-12-20 DIAGNOSIS — R29898 Other symptoms and signs involving the musculoskeletal system: Secondary | ICD-10-CM | POA: Diagnosis not present

## 2017-12-20 DIAGNOSIS — M961 Postlaminectomy syndrome, not elsewhere classified: Secondary | ICD-10-CM | POA: Diagnosis not present

## 2017-12-20 DIAGNOSIS — G894 Chronic pain syndrome: Secondary | ICD-10-CM | POA: Diagnosis not present

## 2017-12-26 DIAGNOSIS — E291 Testicular hypofunction: Secondary | ICD-10-CM | POA: Diagnosis not present

## 2018-01-10 DIAGNOSIS — F419 Anxiety disorder, unspecified: Secondary | ICD-10-CM | POA: Diagnosis not present

## 2018-01-10 DIAGNOSIS — E291 Testicular hypofunction: Secondary | ICD-10-CM | POA: Diagnosis not present

## 2018-01-10 DIAGNOSIS — I10 Essential (primary) hypertension: Secondary | ICD-10-CM | POA: Diagnosis not present

## 2018-01-10 DIAGNOSIS — E785 Hyperlipidemia, unspecified: Secondary | ICD-10-CM | POA: Diagnosis not present

## 2018-01-10 DIAGNOSIS — Z125 Encounter for screening for malignant neoplasm of prostate: Secondary | ICD-10-CM | POA: Diagnosis not present

## 2018-01-24 DIAGNOSIS — E291 Testicular hypofunction: Secondary | ICD-10-CM | POA: Diagnosis not present

## 2018-02-06 DIAGNOSIS — L219 Seborrheic dermatitis, unspecified: Secondary | ICD-10-CM | POA: Diagnosis not present

## 2018-02-06 DIAGNOSIS — L299 Pruritus, unspecified: Secondary | ICD-10-CM | POA: Diagnosis not present

## 2018-02-06 DIAGNOSIS — L820 Inflamed seborrheic keratosis: Secondary | ICD-10-CM | POA: Diagnosis not present

## 2018-02-06 DIAGNOSIS — L853 Xerosis cutis: Secondary | ICD-10-CM | POA: Diagnosis not present

## 2018-02-07 DIAGNOSIS — Z23 Encounter for immunization: Secondary | ICD-10-CM | POA: Diagnosis not present

## 2018-02-07 DIAGNOSIS — F331 Major depressive disorder, recurrent, moderate: Secondary | ICD-10-CM | POA: Diagnosis not present

## 2018-02-07 DIAGNOSIS — E291 Testicular hypofunction: Secondary | ICD-10-CM | POA: Diagnosis not present

## 2018-02-07 DIAGNOSIS — F419 Anxiety disorder, unspecified: Secondary | ICD-10-CM | POA: Diagnosis not present

## 2018-02-21 DIAGNOSIS — E291 Testicular hypofunction: Secondary | ICD-10-CM | POA: Diagnosis not present

## 2018-02-22 DIAGNOSIS — M961 Postlaminectomy syndrome, not elsewhere classified: Secondary | ICD-10-CM | POA: Diagnosis not present

## 2018-02-22 DIAGNOSIS — G894 Chronic pain syndrome: Secondary | ICD-10-CM | POA: Diagnosis not present

## 2018-02-22 DIAGNOSIS — G890 Central pain syndrome: Secondary | ICD-10-CM | POA: Diagnosis not present

## 2018-02-22 DIAGNOSIS — R29898 Other symptoms and signs involving the musculoskeletal system: Secondary | ICD-10-CM | POA: Diagnosis not present

## 2018-03-08 DIAGNOSIS — E291 Testicular hypofunction: Secondary | ICD-10-CM | POA: Diagnosis not present

## 2018-03-22 DIAGNOSIS — F419 Anxiety disorder, unspecified: Secondary | ICD-10-CM | POA: Diagnosis not present

## 2018-03-22 DIAGNOSIS — E291 Testicular hypofunction: Secondary | ICD-10-CM | POA: Diagnosis not present

## 2018-04-11 DIAGNOSIS — E291 Testicular hypofunction: Secondary | ICD-10-CM | POA: Diagnosis not present

## 2018-04-19 DIAGNOSIS — Z79899 Other long term (current) drug therapy: Secondary | ICD-10-CM | POA: Diagnosis not present

## 2018-04-19 DIAGNOSIS — M961 Postlaminectomy syndrome, not elsewhere classified: Secondary | ICD-10-CM | POA: Diagnosis not present

## 2018-04-19 DIAGNOSIS — G890 Central pain syndrome: Secondary | ICD-10-CM | POA: Diagnosis not present

## 2018-04-19 DIAGNOSIS — Z5181 Encounter for therapeutic drug level monitoring: Secondary | ICD-10-CM | POA: Diagnosis not present

## 2018-04-19 DIAGNOSIS — R29898 Other symptoms and signs involving the musculoskeletal system: Secondary | ICD-10-CM | POA: Diagnosis not present

## 2018-04-19 DIAGNOSIS — G894 Chronic pain syndrome: Secondary | ICD-10-CM | POA: Diagnosis not present

## 2018-04-26 DIAGNOSIS — E291 Testicular hypofunction: Secondary | ICD-10-CM | POA: Diagnosis not present

## 2018-04-26 DIAGNOSIS — E785 Hyperlipidemia, unspecified: Secondary | ICD-10-CM | POA: Diagnosis not present

## 2018-04-26 DIAGNOSIS — J302 Other seasonal allergic rhinitis: Secondary | ICD-10-CM | POA: Diagnosis not present

## 2018-04-26 DIAGNOSIS — I10 Essential (primary) hypertension: Secondary | ICD-10-CM | POA: Diagnosis not present

## 2018-04-26 DIAGNOSIS — F419 Anxiety disorder, unspecified: Secondary | ICD-10-CM | POA: Diagnosis not present

## 2018-05-10 DIAGNOSIS — E291 Testicular hypofunction: Secondary | ICD-10-CM | POA: Diagnosis not present

## 2018-05-22 DIAGNOSIS — F419 Anxiety disorder, unspecified: Secondary | ICD-10-CM | POA: Diagnosis not present

## 2018-05-22 DIAGNOSIS — G4700 Insomnia, unspecified: Secondary | ICD-10-CM | POA: Diagnosis not present

## 2018-06-06 DIAGNOSIS — Z6829 Body mass index (BMI) 29.0-29.9, adult: Secondary | ICD-10-CM | POA: Diagnosis not present

## 2018-06-06 DIAGNOSIS — J029 Acute pharyngitis, unspecified: Secondary | ICD-10-CM | POA: Diagnosis not present

## 2018-06-06 DIAGNOSIS — E291 Testicular hypofunction: Secondary | ICD-10-CM | POA: Diagnosis not present

## 2018-06-20 DIAGNOSIS — E291 Testicular hypofunction: Secondary | ICD-10-CM | POA: Diagnosis not present

## 2018-06-20 DIAGNOSIS — F419 Anxiety disorder, unspecified: Secondary | ICD-10-CM | POA: Diagnosis not present

## 2018-06-20 DIAGNOSIS — G4700 Insomnia, unspecified: Secondary | ICD-10-CM | POA: Diagnosis not present

## 2018-06-21 DIAGNOSIS — F419 Anxiety disorder, unspecified: Secondary | ICD-10-CM | POA: Diagnosis not present

## 2018-06-25 DIAGNOSIS — G890 Central pain syndrome: Secondary | ICD-10-CM | POA: Diagnosis not present

## 2018-06-25 DIAGNOSIS — G894 Chronic pain syndrome: Secondary | ICD-10-CM | POA: Diagnosis not present

## 2018-06-25 DIAGNOSIS — M961 Postlaminectomy syndrome, not elsewhere classified: Secondary | ICD-10-CM | POA: Diagnosis not present

## 2018-06-25 DIAGNOSIS — R29898 Other symptoms and signs involving the musculoskeletal system: Secondary | ICD-10-CM | POA: Diagnosis not present

## 2018-07-04 DIAGNOSIS — E291 Testicular hypofunction: Secondary | ICD-10-CM | POA: Diagnosis not present

## 2018-07-18 DIAGNOSIS — F419 Anxiety disorder, unspecified: Secondary | ICD-10-CM | POA: Diagnosis not present

## 2018-07-18 DIAGNOSIS — E291 Testicular hypofunction: Secondary | ICD-10-CM | POA: Diagnosis not present

## 2018-08-01 DIAGNOSIS — Z125 Encounter for screening for malignant neoplasm of prostate: Secondary | ICD-10-CM | POA: Diagnosis not present

## 2018-08-01 DIAGNOSIS — Z0000 Encounter for general adult medical examination without abnormal findings: Secondary | ICD-10-CM | POA: Diagnosis not present

## 2018-08-01 DIAGNOSIS — Z136 Encounter for screening for cardiovascular disorders: Secondary | ICD-10-CM | POA: Diagnosis not present

## 2018-08-01 DIAGNOSIS — Z9181 History of falling: Secondary | ICD-10-CM | POA: Diagnosis not present

## 2018-08-01 DIAGNOSIS — Z1331 Encounter for screening for depression: Secondary | ICD-10-CM | POA: Diagnosis not present

## 2018-08-01 DIAGNOSIS — E291 Testicular hypofunction: Secondary | ICD-10-CM | POA: Diagnosis not present

## 2018-08-01 DIAGNOSIS — E785 Hyperlipidemia, unspecified: Secondary | ICD-10-CM | POA: Diagnosis not present

## 2018-08-01 DIAGNOSIS — I10 Essential (primary) hypertension: Secondary | ICD-10-CM | POA: Diagnosis not present

## 2018-08-01 DIAGNOSIS — F419 Anxiety disorder, unspecified: Secondary | ICD-10-CM | POA: Diagnosis not present

## 2018-08-15 DIAGNOSIS — Z6829 Body mass index (BMI) 29.0-29.9, adult: Secondary | ICD-10-CM | POA: Diagnosis not present

## 2018-08-15 DIAGNOSIS — F419 Anxiety disorder, unspecified: Secondary | ICD-10-CM | POA: Diagnosis not present

## 2018-08-15 DIAGNOSIS — E291 Testicular hypofunction: Secondary | ICD-10-CM | POA: Diagnosis not present

## 2018-08-15 DIAGNOSIS — J208 Acute bronchitis due to other specified organisms: Secondary | ICD-10-CM | POA: Diagnosis not present

## 2018-08-15 DIAGNOSIS — J301 Allergic rhinitis due to pollen: Secondary | ICD-10-CM | POA: Diagnosis not present

## 2018-08-20 DIAGNOSIS — F419 Anxiety disorder, unspecified: Secondary | ICD-10-CM | POA: Diagnosis not present

## 2018-08-21 DIAGNOSIS — M961 Postlaminectomy syndrome, not elsewhere classified: Secondary | ICD-10-CM | POA: Diagnosis not present

## 2018-08-21 DIAGNOSIS — R29898 Other symptoms and signs involving the musculoskeletal system: Secondary | ICD-10-CM | POA: Diagnosis not present

## 2018-08-21 DIAGNOSIS — G894 Chronic pain syndrome: Secondary | ICD-10-CM | POA: Diagnosis not present

## 2018-08-21 DIAGNOSIS — G890 Central pain syndrome: Secondary | ICD-10-CM | POA: Diagnosis not present

## 2018-08-29 DIAGNOSIS — F419 Anxiety disorder, unspecified: Secondary | ICD-10-CM | POA: Diagnosis not present

## 2018-09-11 DIAGNOSIS — G4700 Insomnia, unspecified: Secondary | ICD-10-CM | POA: Diagnosis not present

## 2018-09-12 DIAGNOSIS — J020 Streptococcal pharyngitis: Secondary | ICD-10-CM | POA: Diagnosis not present

## 2018-09-12 DIAGNOSIS — E291 Testicular hypofunction: Secondary | ICD-10-CM | POA: Diagnosis not present

## 2018-09-12 DIAGNOSIS — F419 Anxiety disorder, unspecified: Secondary | ICD-10-CM | POA: Diagnosis not present

## 2018-09-24 DIAGNOSIS — L219 Seborrheic dermatitis, unspecified: Secondary | ICD-10-CM | POA: Diagnosis not present

## 2018-09-24 DIAGNOSIS — L853 Xerosis cutis: Secondary | ICD-10-CM | POA: Diagnosis not present

## 2018-09-24 DIAGNOSIS — L299 Pruritus, unspecified: Secondary | ICD-10-CM | POA: Diagnosis not present

## 2018-09-26 DIAGNOSIS — F419 Anxiety disorder, unspecified: Secondary | ICD-10-CM | POA: Diagnosis not present

## 2018-09-27 DIAGNOSIS — E291 Testicular hypofunction: Secondary | ICD-10-CM | POA: Diagnosis not present

## 2018-10-03 DIAGNOSIS — F419 Anxiety disorder, unspecified: Secondary | ICD-10-CM | POA: Diagnosis not present

## 2018-10-11 DIAGNOSIS — L03115 Cellulitis of right lower limb: Secondary | ICD-10-CM | POA: Diagnosis not present

## 2018-10-11 DIAGNOSIS — S81801A Unspecified open wound, right lower leg, initial encounter: Secondary | ICD-10-CM | POA: Diagnosis not present

## 2018-10-11 DIAGNOSIS — Z23 Encounter for immunization: Secondary | ICD-10-CM | POA: Diagnosis not present

## 2018-10-11 DIAGNOSIS — E291 Testicular hypofunction: Secondary | ICD-10-CM | POA: Diagnosis not present

## 2018-10-17 DIAGNOSIS — F419 Anxiety disorder, unspecified: Secondary | ICD-10-CM | POA: Diagnosis not present

## 2018-10-19 DIAGNOSIS — M961 Postlaminectomy syndrome, not elsewhere classified: Secondary | ICD-10-CM | POA: Diagnosis not present

## 2018-10-19 DIAGNOSIS — G894 Chronic pain syndrome: Secondary | ICD-10-CM | POA: Diagnosis not present

## 2018-10-19 DIAGNOSIS — G890 Central pain syndrome: Secondary | ICD-10-CM | POA: Diagnosis not present

## 2018-10-19 DIAGNOSIS — S14105S Unspecified injury at C5 level of cervical spinal cord, sequela: Secondary | ICD-10-CM | POA: Diagnosis not present

## 2018-11-07 DIAGNOSIS — I10 Essential (primary) hypertension: Secondary | ICD-10-CM | POA: Diagnosis not present

## 2018-11-07 DIAGNOSIS — F419 Anxiety disorder, unspecified: Secondary | ICD-10-CM | POA: Diagnosis not present

## 2018-11-07 DIAGNOSIS — E785 Hyperlipidemia, unspecified: Secondary | ICD-10-CM | POA: Diagnosis not present

## 2018-11-07 DIAGNOSIS — Z139 Encounter for screening, unspecified: Secondary | ICD-10-CM | POA: Diagnosis not present

## 2018-11-07 DIAGNOSIS — E291 Testicular hypofunction: Secondary | ICD-10-CM | POA: Diagnosis not present

## 2018-11-16 DIAGNOSIS — G890 Central pain syndrome: Secondary | ICD-10-CM | POA: Diagnosis not present

## 2018-11-16 DIAGNOSIS — G894 Chronic pain syndrome: Secondary | ICD-10-CM | POA: Diagnosis not present

## 2018-11-16 DIAGNOSIS — R29898 Other symptoms and signs involving the musculoskeletal system: Secondary | ICD-10-CM | POA: Diagnosis not present

## 2018-11-16 DIAGNOSIS — M961 Postlaminectomy syndrome, not elsewhere classified: Secondary | ICD-10-CM | POA: Diagnosis not present

## 2018-11-27 DIAGNOSIS — F419 Anxiety disorder, unspecified: Secondary | ICD-10-CM | POA: Diagnosis not present

## 2018-11-28 DIAGNOSIS — F419 Anxiety disorder, unspecified: Secondary | ICD-10-CM | POA: Diagnosis not present

## 2018-11-29 DIAGNOSIS — E291 Testicular hypofunction: Secondary | ICD-10-CM | POA: Diagnosis not present

## 2018-11-29 DIAGNOSIS — G894 Chronic pain syndrome: Secondary | ICD-10-CM | POA: Diagnosis not present

## 2018-11-29 DIAGNOSIS — F419 Anxiety disorder, unspecified: Secondary | ICD-10-CM | POA: Diagnosis not present

## 2018-11-29 DIAGNOSIS — M48061 Spinal stenosis, lumbar region without neurogenic claudication: Secondary | ICD-10-CM | POA: Diagnosis not present

## 2018-12-03 DIAGNOSIS — G4700 Insomnia, unspecified: Secondary | ICD-10-CM | POA: Diagnosis not present

## 2019-01-08 DEATH — deceased
